# Patient Record
Sex: Female | Born: 1965 | Marital: Married | Smoking: Never smoker
Health system: Southern US, Community
[De-identification: ages and names within clinical notes are randomized; demographics above are authoritative.]

## PROBLEM LIST (undated history)

## (undated) DIAGNOSIS — H40059 Ocular hypertension, unspecified eye: Secondary | ICD-10-CM

## (undated) DIAGNOSIS — D259 Leiomyoma of uterus, unspecified: Secondary | ICD-10-CM

## (undated) DIAGNOSIS — IMO0001 Reserved for inherently not codable concepts without codable children: Secondary | ICD-10-CM

## (undated) DIAGNOSIS — R932 Abnormal findings on diagnostic imaging of liver and biliary tract: Secondary | ICD-10-CM

## (undated) HISTORY — DX: Reserved for inherently not codable concepts without codable children: IMO0001

## (undated) HISTORY — DX: Leiomyoma of uterus, unspecified: D25.9

## (undated) HISTORY — DX: Ocular hypertension, unspecified eye: H40.059

## (undated) HISTORY — DX: Abnormal findings on diagnostic imaging of liver and biliary tract: R93.2

---

## 2010-12-29 ENCOUNTER — Encounter: Payer: Self-pay | Admitting: Gastroenterology

## 2010-12-30 ENCOUNTER — Other Ambulatory Visit (HOSPITAL_COMMUNITY): Payer: Self-pay | Admitting: Gastroenterology

## 2010-12-30 DIAGNOSIS — R110 Nausea: Secondary | ICD-10-CM

## 2011-01-13 ENCOUNTER — Ambulatory Visit (HOSPITAL_COMMUNITY)
Admission: RE | Admit: 2011-01-13 | Discharge: 2011-01-13 | Disposition: A | Discharge status: Home / Self Care | Payer: BC Managed Care – PPO | Source: Ambulatory Visit | Attending: Gastroenterology | Admitting: Gastroenterology

## 2011-01-13 DIAGNOSIS — R110 Nausea: Secondary | ICD-10-CM

## 2011-01-13 DIAGNOSIS — R109 Unspecified abdominal pain: Secondary | ICD-10-CM | POA: Insufficient documentation

## 2011-01-13 DIAGNOSIS — N281 Cyst of kidney, acquired: Secondary | ICD-10-CM | POA: Insufficient documentation

## 2011-01-13 MED ORDER — TECHNETIUM TC 99M MEBROFENIN IV KIT
5.0000 | PACK | Freq: Once | INTRAVENOUS | Status: AC | PRN
  Administered 2011-01-13: 5 via INTRAVENOUS

## 2011-01-20 ENCOUNTER — Ambulatory Visit: Payer: Self-pay | Admitting: Gastroenterology

## 2011-02-09 ENCOUNTER — Encounter: Payer: Self-pay | Admitting: Internal Medicine

## 2011-02-23 ENCOUNTER — Ambulatory Visit (INDEPENDENT_AMBULATORY_CARE_PROVIDER_SITE_OTHER): Payer: BC Managed Care – PPO | Admitting: Internal Medicine

## 2011-02-23 ENCOUNTER — Encounter: Payer: Self-pay | Admitting: Internal Medicine

## 2011-02-23 VITALS — BP 100/72 | HR 96 | Temp 98.3°F | Ht 63.0 in | Wt 121.0 lb

## 2011-02-23 DIAGNOSIS — D219 Benign neoplasm of connective and other soft tissue, unspecified: Secondary | ICD-10-CM

## 2011-02-23 DIAGNOSIS — H40059 Ocular hypertension, unspecified eye: Secondary | ICD-10-CM

## 2011-02-23 DIAGNOSIS — R932 Abnormal findings on diagnostic imaging of liver and biliary tract: Secondary | ICD-10-CM

## 2011-02-23 DIAGNOSIS — H40009 Preglaucoma, unspecified, unspecified eye: Secondary | ICD-10-CM

## 2011-02-23 DIAGNOSIS — L709 Acne, unspecified: Secondary | ICD-10-CM

## 2011-02-23 DIAGNOSIS — D259 Leiomyoma of uterus, unspecified: Secondary | ICD-10-CM

## 2011-02-23 DIAGNOSIS — Z0000 Encounter for general adult medical examination without abnormal findings: Secondary | ICD-10-CM

## 2011-02-23 DIAGNOSIS — L708 Other acne: Secondary | ICD-10-CM

## 2011-02-23 MED ORDER — CLINDAMYCIN PHOS-BENZOYL PEROX 1-5 % EX GEL: CUTANEOUS | Status: DC

## 2011-02-23 NOTE — Progress Notes (Signed)
Subjective:    Patient ID: Katie Lawson, female    DOB: 15-Jul-1965, 45 y.o.   MRN: [id number]  HPI Patient comes in as new patient visit . Previous care was  In [facility] where she lived previously. She does have GYNE check this year Dr Juliene Pina dx of fibroids .   She recently underwent evaluation for acute abd/chest  pain per Dr Loreta Ave. No cv problem in ed evaluation.  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  She has no chronic meds.  Is monitored for Poss IOP stable No injury does battle knee pain  Anterior with some activity but does  Yoga and other activities  No cv pulm limitations  Review of Systems ROS:  GEN/ HEENTNo fever, significant weight changes sweats headaches vision problems hearing changes, CV/ PULM; No chest pain shortness of breath cough, syncope,edema  change in exercise tolerance. GI /GU: No adominal pain, vomiting, change in bowel habits. No blood in the stool. No significant GU symptoms. SKIN/HEME: ,no acute skin rashes suspicious lesions or bleeding. No lymphadenopathy, nodules, masses.  Has had adult acne and benza clin has helped in the past.  NEURO/ PSYCH:  No neurologic signs such as weakness numbness No depression anxiety. IMM/ Allergy: No unusual infections.  Allergy .   REST of 12 system review negative Past Medical History  Diagnosis Date  . Elevated IOP     watch for glaucoma  . Abnormal findings on diagnostic imaging of gall bladder     low EF    History   Social History  . Marital Status: Married    Spouse Name: N/A    Number of Children: N/A  . Years of Education: N/A   Occupational History  . Not on file.   Social History Main Topics  . Smoking status: Never Smoker   . Smokeless tobacco: Never Used  . Alcohol Use: No  . Drug Use: Not on file  . Sexually Active: Not on file   Other Topics Concern  . Not on file   Social History Narrative   H H  of  4 Married  Masters education   homemakerHusband works for [employer]  Smoke alarm seat belts no ets[place]    History reviewed. No pertinent past surgical history.  Family History  Problem Relation Age of Onset  . Hypertension Mother   . Healthy Daughter   . Healthy Daughter   . Breast cancer      GParent    No Known Allergies  Current Outpatient Prescriptions on File Prior to Visit  Medication Sig Dispense Refill  . clindamycin-benzoyl peroxide (BENZACLIN) gel Apply to affected area 2 times daily for adult acne  25 g  3    BP 100/72  Pulse 96  Temp(Src) 98.3 F (36.8 C) (Oral)  Ht 5\' 3"  (1.6 m)  Wt 121 lb (54.885 kg)  BMI 21.43 kg/m2  SpO2 99%  LMP 02/14/2011        Objective:   Physical Exam Physical Exam: Vital signs reviewed [id number] is a well-developed well-nourished alert cooperative  [place] female who appears her stated age in no acute distress.  HEENT: normocephalic  traumatic , Eyes: PERRL EOM's full, conjunctiva clear, Nares: paten,t no deformity discharge or tenderness., Ears: no deformity EAC's clear TMs with normal landmarks. Mouth: clear OP, no lesions, edema.  Moist mucous membranes. Dentition in adequate repair. NECK: supple without masses,  thyromegaly or bruits. CHEST/PULM:  Clear to auscultation and percussion breath sounds equal no wheeze , rales or rhonchi. No chest wall deformities or tenderness. CV: PMI is nondisplaced, S1 S2 no gallops, murmurs, rubs. Peripheral pulses are full without delay.No JVD .  Breast: normal by inspection . No dimpling, discharge, masses, tenderness or discharge .  ABDOMEN: Bowel sounds normal nontender  No guard or rebound, no hepato splenomegal no CVA tenderness.  No hernia. Extremtities:  No clubbing cyanosis or edema, no acute joint swelling or redness no focal atrophy NEURO:  Oriented x3, cranial nerves 3-12 appear to be intact, no obvious focal weakness,gait within normal limits no abnormal reflexes or asymmetrical SKIN:  No acute rashes normal turgor, color, no bruising or petechiae. Few papules on face  Acneiform with old hyperpigmentation PSYCH: Oriented, good eye contact, no obvious depression anxiety, cognition and judgment appear normal.  GYNE Per GYNE Labs form Dr Loreta Ave reviewed all normal cmet cbc tsh  No lipid but no hx of issues    Assessment & Plan:  Preventive Health Care  Counseled regarding healthy nutrition, exercise, sleep, injury prevention, calcium vit d and healthy weight . Has healthy ls had flu shot td un 2011 and pap 2012    Hx of fibroids per gyne EYE monitoring yearly for glaucoma.  Adult acne  Hx of good response to topical and will rx for this today and see how she does GB dysfunction  Now asymtomatic  Observing over

## 2011-02-23 NOTE — Patient Instructions (Signed)
Continue lifestyle intervention healthy eating and exercise . CHECK UP IN 2 YEARS or as needed.

## 2011-02-24 ENCOUNTER — Encounter: Payer: Self-pay | Admitting: Internal Medicine

## 2011-02-24 DIAGNOSIS — Z0000 Encounter for general adult medical examination without abnormal findings: Secondary | ICD-10-CM | POA: Insufficient documentation

## 2011-02-24 DIAGNOSIS — D219 Benign neoplasm of connective and other soft tissue, unspecified: Secondary | ICD-10-CM | POA: Insufficient documentation

## 2011-02-24 DIAGNOSIS — H40059 Ocular hypertension, unspecified eye: Secondary | ICD-10-CM | POA: Insufficient documentation

## 2011-02-24 DIAGNOSIS — L709 Acne, unspecified: Secondary | ICD-10-CM | POA: Insufficient documentation

## 2011-02-24 DIAGNOSIS — R932 Abnormal findings on diagnostic imaging of liver and biliary tract: Secondary | ICD-10-CM | POA: Insufficient documentation

## 2012-01-26 ENCOUNTER — Telehealth: Payer: Self-pay | Admitting: Internal Medicine

## 2012-01-26 NOTE — Telephone Encounter (Signed)
Pt unable to do wednesdays

## 2012-01-26 NOTE — Telephone Encounter (Signed)
Pt would like to be worked in for CPX  In Dec or Jan. Please advise when we can work her in.

## 2012-01-26 NOTE — Telephone Encounter (Signed)
How about Wednesday January 8th

## 2012-01-27 NOTE — Telephone Encounter (Signed)
How about Monday pm Jan 13th?  if not asks when she can come.

## 2012-01-30 ENCOUNTER — Encounter: Payer: Self-pay | Admitting: Internal Medicine

## 2012-01-30 NOTE — Telephone Encounter (Signed)
Pt scheduled 04/09/2012 for CPX @ 8:15am!

## 2012-01-31 ENCOUNTER — Ambulatory Visit (INDEPENDENT_AMBULATORY_CARE_PROVIDER_SITE_OTHER): Payer: BC Managed Care – PPO | Admitting: Family Medicine

## 2012-01-31 DIAGNOSIS — Z23 Encounter for immunization: Secondary | ICD-10-CM

## 2012-02-03 ENCOUNTER — Encounter: Payer: Self-pay | Admitting: Internal Medicine

## 2012-02-24 ENCOUNTER — Encounter: Payer: Self-pay | Admitting: Internal Medicine

## 2012-04-03 ENCOUNTER — Other Ambulatory Visit (INDEPENDENT_AMBULATORY_CARE_PROVIDER_SITE_OTHER): Payer: BC Managed Care – PPO

## 2012-04-03 DIAGNOSIS — Z0000 Encounter for general adult medical examination without abnormal findings: Secondary | ICD-10-CM

## 2012-04-03 LAB — BASIC METABOLIC PANEL
CO2: 27 mEq/L (ref 19–32)
Calcium: 8.8 mg/dL (ref 8.4–10.5)
Chloride: 103 mEq/L (ref 96–112)
Glucose, Bld: 88 mg/dL (ref 70–99)
Potassium: 4.1 mEq/L (ref 3.5–5.1)
Sodium: 135 mEq/L (ref 135–145)

## 2012-04-03 LAB — LIPID PANEL
HDL: 44.7 mg/dL (ref >39.00)
LDL Cholesterol: 94 mg/dL (ref 0–99)
Total CHOL/HDL Ratio: 4
Triglycerides: 128 mg/dL (ref 0.0–149.0)
VLDL: 25.6 mg/dL (ref 0.0–40.0)

## 2012-04-03 LAB — CBC WITH DIFFERENTIAL/PLATELET
Basophils Absolute: 0 10*3/uL (ref 0.0–0.1)
Eosinophils Relative: 1.5 % (ref 0.0–5.0)
HCT: 34.8 % — ABNORMAL LOW (ref 36.0–46.0)
Lymphocytes Relative: 36.4 % (ref 12.0–46.0)
Lymphs Abs: 2.5 10*3/uL (ref 0.7–4.0)
Monocytes Relative: 5.7 % (ref 3.0–12.0)
Platelets: 206 10*3/uL (ref 150.0–400.0)
WBC: 6.9 10*3/uL (ref 4.5–10.5)

## 2012-04-03 LAB — HEPATIC FUNCTION PANEL
Albumin: 3.7 g/dL (ref 3.5–5.2)
Total Protein: 6.6 g/dL (ref 6.0–8.3)

## 2012-04-03 LAB — TSH: TSH: 2.56 u[IU]/mL (ref 0.35–5.50)

## 2012-04-09 ENCOUNTER — Encounter: Payer: BC Managed Care – PPO | Admitting: Internal Medicine

## 2012-04-09 ENCOUNTER — Encounter: Payer: Self-pay | Admitting: Internal Medicine

## 2012-04-09 NOTE — Progress Notes (Signed)
Pt came late for her appt and had to reeschedule  Document opened for review

## 2012-04-18 ENCOUNTER — Ambulatory Visit (INDEPENDENT_AMBULATORY_CARE_PROVIDER_SITE_OTHER): Payer: BC Managed Care – PPO | Admitting: Internal Medicine

## 2012-04-18 ENCOUNTER — Encounter: Payer: Self-pay | Admitting: Internal Medicine

## 2012-04-18 VITALS — BP 118/80 | HR 78 | Temp 97.8°F | Ht 63.0 in | Wt 120.0 lb

## 2012-04-18 DIAGNOSIS — Z0000 Encounter for general adult medical examination without abnormal findings: Secondary | ICD-10-CM

## 2012-04-18 DIAGNOSIS — L709 Acne, unspecified: Secondary | ICD-10-CM

## 2012-04-18 DIAGNOSIS — L708 Other acne: Secondary | ICD-10-CM

## 2012-04-18 DIAGNOSIS — D649 Anemia, unspecified: Secondary | ICD-10-CM | POA: Insufficient documentation

## 2012-04-18 MED ORDER — CLINDAMYCIN PHOS-BENZOYL PEROX 1-5 % EX GEL: CUTANEOUS | Status: DC

## 2012-04-18 NOTE — Progress Notes (Signed)
Chief Complaint  Patient presents with  . Annual Exam    refill acne med    HPI: Patient comes in today for Preventive Health Care visit  No major change in health status since last visit . Monitoring mammogram ;periods reg  Not that heavy  Last 5 days ocass momentary light headed with standing up .  But no exercise intolerance very mild sx  Like a shift  Last moments not minutes.  No other sx.  Has prominent color veins in legs sometimes ache at end of day no swelling sig. ROS:  GEN/ HEENT: No fever, significant weight changes sweats headaches vision problems hearing changes, CV/ PULM; No chest pain shortness of breath cough, syncope,edema  change in exercise tolerance. GI /GU: No adominal pain, vomiting, change in bowel habits. No blood in the stool. No significant GU symptoms. SKIN/HEME: ,no acute skin rashes suspicious lesions or bleeding. No lymphadenopathy, nodules, masses.  NEURO/ PSYCH:  No neurologic signs such as weakness numbness. No depression anxiety. IMM/ Allergy: No unusual infections.  Allergy .   REST of 12 system review negative except as per HPI   Past Medical History  Diagnosis Date  . Elevated IOP     watch for glaucoma  . Abnormal findings on diagnostic imaging of gall bladder     low EF   fam hx mom ht mat uncle had some Mis in 42s  mgf heart death  Elderly aunt with poss arryhtmias . No first degree relative with premature scd etc .  Family History  Problem Relation Age of Onset  . Hypertension Mother   . Healthy Daughter   . Healthy Daughter   . Breast cancer      GParent  . Heart attack      mgf [facility]    History   Social History  . Marital Status: Married    Spouse Name: N/A    Number of Children: N/A  . Years of Education: N/A   Social History Main Topics  . Smoking status: Never Smoker   . Smokeless tobacco: Never Used  . Alcohol Use: No  . Drug Use: None  . Sexually Active: None   Other Topics Concern  . None   Social History  Narrative   H H  of  4 Married  Masters education  homemakerHusband works for [employer]  Smoke alarm seat belts no ets. [place]Exercise  At least 3 x per week. Caffiene; diet coke .     Outpatient Encounter Prescriptions as of 04/18/2012  Medication Sig Dispense Refill  . clindamycin-benzoyl peroxide (BENZACLIN) gel Apply to affected area 2 times daily for adult acne  25 g  3  . [DISCONTINUED] clindamycin-benzoyl peroxide (BENZACLIN) gel Apply to affected area 2 times daily for adult acne  25 g  3    EXAM:  BP 118/80  Pulse 78  Temp 97.8 F (36.6 C) (Oral)  Ht 5\' 3"  (1.6 m)  Wt 120 lb (54.432 kg)  BMI 21.26 kg/m2  SpO2 98%  LMP 04/17/2012  Body mass index is 21.26 kg/(m^2).  Physical Exam: Vital signs reviewed BJY:NWGN is a well-developed well-nourished alert cooperative   female who appears her stated age in no acute distress.  HEENT: normocephalic atraumatic , Eyes: PERRL EOM's full, conjunctiva clear, Nares: paten,t no deformity discharge or tenderness., Ears: no deformity EAC's clear TMs with normal landmarks. Mouth: clear OP, no lesions, edema.  Moist mucous membranes. Dentition in adequate repair. NECK: supple without  masses, thyromegaly or bruits. CHEST/PULM:  Clear to auscultation and percussion breath sounds equal no wheeze , rales or rhonchi. No chest wall deformities or tenderness. CV: PMI is nondisplaced, S1 S2 no gallops, murmurs, rubs. Peripheral pulses are full without delay.No JVD .  Breast: normal by inspection . No dimpling, discharge, masses, tenderness or discharge  ABDOMEN: Bowel sounds normal nontender  No guard or rebound, no hepato splenomegal no CVA tenderness.  No hernia. Extremtities:  No clubbing cyanosis or edema, no acute joint swelling or redness no focal atrophy few prominent veins   No ulcers edema . Redness  NEURO:  Oriented x3, cranial nerves 3-12 appear to be intact, no obvious focal weakness,gait within normal limits  no abnormal reflexes or asymmetrical SKIN: No acute rashes normal turgor, color, no bruising or petechiae. fe papules on face  No stria  PSYCH: Oriented, good eye contact, no obvious depression anxiety, cognition and judgment appear normal. LN: no cervical axillary inguinal adenopathy  Lab Results  Component Value Date   WBC 6.9 04/03/2012   HGB 11.8* 04/03/2012   HCT 34.8* 04/03/2012   PLT 206.0 04/03/2012   GLUCOSE 88 04/03/2012   CHOL 164 04/03/2012   TRIG 128.0 04/03/2012   HDL 44.70 04/03/2012   LDLCALC 94 04/03/2012   ALT 17 04/03/2012   AST 18 04/03/2012   NA 135 04/03/2012   K 4.1 04/03/2012   CL 103 04/03/2012   CREATININE 0.6 04/03/2012   BUN 13 04/03/2012   CO2 27 04/03/2012   TSH 2.56 04/03/2012    ASSESSMENT AND PLAN:  Discussed the following assessment and plan:  1. Visit for preventive health examination   2. Mild anemia    prob iron defi  supplement and pay attnetion to diet  fu if needed  3. Adult acne    refill med doing well with this   Minimal venous prominence try otc compression when upright for a while  Monitor the misl sx and fu if  persistent or progressive  Normal exam  Patient Care Team: Madelin Headings, MD as PCP - General (Internal Medicine) Robley Fries, MD as Attending Physician (Obstetrics and Gynecology) Charna Elizabeth, MD as Attending Physician (Gastroenterology) Patient Instructions  Continue lifestyle intervention healthy eating and exercise . Fu if  Getting increasing  Sx of concern . Your heart exam is good and nl blood pressure.You are mildly anemia probably iron deficient from  Iron loss in periods.  Can take extra iron pill ferrous sulfate or other preps every day for a month and then womens multivit with iron t help . Can recheck  Lab if concerning .    Preventive Care for Adults, Female A healthy lifestyle and preventive care can promote health and wellness. Preventive health guidelines for women include the following key practices.  A routine yearly  physical is a good way to check with your caregiver about your health and preventive screening. It is a chance to share any concerns and updates on your health, and to receive a thorough exam.  Visit your dentist for a routine exam and preventive care every 6 months. Brush your teeth twice a day and floss once a day. Good oral hygiene prevents tooth decay and gum disease.  The frequency of eye exams is based on your age, health, family medical history, use of contact lenses, and other factors. Follow your caregiver's recommendations for frequency of eye exams.  Eat a healthy diet. Foods like vegetables, fruits, whole grains, low-fat dairy products,  and lean protein foods contain the nutrients you need without too many calories. Decrease your intake of foods high in solid fats, added sugars, and salt. Eat the right amount of calories for you.Get information about a proper diet from your caregiver, if necessary.  Regular physical exercise is one of the most important things you can do for your health. Most adults should get at least 150 minutes of moderate-intensity exercise (any activity that increases your heart rate and causes you to sweat) each week. In addition, most adults need muscle-strengthening exercises on 2 or more days a week.  Maintain a healthy weight. The body mass index (BMI) is a screening tool to identify possible weight problems. It provides an estimate of body fat based on height and weight. Your caregiver can help determine your BMI, and can help you achieve or maintain a healthy weight.For adults 20 years and older:  A BMI below 18.5 is considered underweight.  A BMI of 18.5 to 24.9 is normal.  A BMI of 25 to 29.9 is considered overweight.  A BMI of 30 and above is considered obese.  Maintain normal blood lipids and cholesterol levels by exercising and minimizing your intake of saturated fat. Eat a balanced diet with plenty of fruit and vegetables. Blood tests for lipids  and cholesterol should begin at age 78 and be repeated every 5 years. If your lipid or cholesterol levels are high, you are over 50, or you are at high risk for heart disease, you may need your cholesterol levels checked more frequently.Ongoing high lipid and cholesterol levels should be treated with medicines if diet and exercise are not effective.  If you smoke, find out from your caregiver how to quit. If you do not use tobacco, do not start.  If you are pregnant, do not drink alcohol. If you are breastfeeding, be very cautious about drinking alcohol. If you are not pregnant and choose to drink alcohol, do not exceed 1 drink per day. One drink is considered to be 12 ounces (355 mL) of beer, 5 ounces (148 mL) of wine, or 1.5 ounces (44 mL) of liquor.  Avoid use of street drugs. Do not share needles with anyone. Ask for help if you need support or instructions about stopping the use of drugs.  High blood pressure causes heart disease and increases the risk of stroke. Your blood pressure should be checked at least every 1 to 2 years. Ongoing high blood pressure should be treated with medicines if weight loss and exercise are not effective.  If you are 81 to 47 years old, ask your caregiver if you should take aspirin to prevent strokes.  Diabetes screening involves taking a blood sample to check your fasting blood sugar level. This should be done once every 3 years, after age 31, if you are within normal weight and without risk factors for diabetes. Testing should be considered at a younger age or be carried out more frequently if you are overweight and have at least 1 risk factor for diabetes.  Breast cancer screening is essential preventive care for women. You should practice "breast self-awareness." This means understanding the normal appearance and feel of your breasts and may include breast self-examination. Any changes detected, no matter how small, should be reported to a caregiver. Women in  their 24s and 30s should have a clinical breast exam (CBE) by a caregiver as part of a regular health exam every 1 to 3 years. After age 18, women should have a CBE  every year. Starting at age 46, women should consider having a mammography (breast X-ray test) every year. Women who have a family history of breast cancer should talk to their caregiver about genetic screening. Women at a high risk of breast cancer should talk to their caregivers about having magnetic resonance imaging (MRI) and a mammography every year.  The Pap test is a screening test for cervical cancer. A Pap test can show cell changes on the cervix that might become cervical cancer if left untreated. A Pap test is a procedure in which cells are obtained and examined from the lower end of the uterus (cervix).  Women should have a Pap test starting at age 75.  Between ages 42 and 70, Pap tests should be repeated every 2 years.  Beginning at age 11, you should have a Pap test every 3 years as long as the past 3 Pap tests have been normal.  Some women have medical problems that increase the chance of getting cervical cancer. Talk to your caregiver about these problems. It is especially important to talk to your caregiver if a new problem develops soon after your last Pap test. In these cases, your caregiver may recommend more frequent screening and Pap tests.  The above recommendations are the same for women who have or have not gotten the vaccine for human papillomavirus (HPV).  If you had a hysterectomy for a problem that was not cancer or a condition that could lead to cancer, then you no longer need Pap tests. Even if you no longer need a Pap test, a regular exam is a good idea to make sure no other problems are starting.  If you are between ages 59 and 3, and you have had normal Pap tests going back 10 years, you no longer need Pap tests. Even if you no longer need a Pap test, a regular exam is a good idea to make sure no other  problems are starting.  If you have had past treatment for cervical cancer or a condition that could lead to cancer, you need Pap tests and screening for cancer for at least 20 years after your treatment.  If Pap tests have been discontinued, risk factors (such as a new sexual partner) need to be reassessed to determine if screening should be resumed.  The HPV test is an additional test that may be used for cervical cancer screening. The HPV test looks for the virus that can cause the cell changes on the cervix. The cells collected during the Pap test can be tested for HPV. The HPV test could be used to screen women aged 25 years and older, and should be used in women of any age who have unclear Pap test results. After the age of 20, women should have HPV testing at the same frequency as a Pap test.  Colorectal cancer can be detected and often prevented. Most routine colorectal cancer screening begins at the age of 52 and continues through age 108. However, your caregiver may recommend screening at an earlier age if you have risk factors for colon cancer. On a yearly basis, your caregiver may provide home test kits to check for hidden blood in the stool. Use of a small camera at the end of a tube, to directly examine the colon (sigmoidoscopy or colonoscopy), can detect the earliest forms of colorectal cancer. Talk to your caregiver about this at age 67, when routine screening begins. Direct examination of the colon should be repeated every 5 to  10 years through age 21, unless early forms of pre-cancerous polyps or small growths are found.  Hepatitis C blood testing is recommended for all people born from [birth year] through 1965 and any individual with known risks for hepatitis C.  Practice safe sex. Use condoms and avoid high-risk sexual practices to reduce the spread of sexually transmitted infections (STIs). STIs include gonorrhea, chlamydia, syphilis, trichomonas, herpes, HPV, and human immunodeficiency  virus (HIV). Herpes, HIV, and HPV are viral illnesses that have no cure. They can result in disability, cancer, and death. Sexually active women aged 27 and younger should be checked for chlamydia. Older women with new or multiple partners should also be tested for chlamydia. Testing for other STIs is recommended if you are sexually active and at increased risk.  Osteoporosis is a disease in which the bones lose minerals and strength with aging. This can result in serious bone fractures. The risk of osteoporosis can be identified using a bone density scan. Women ages 61 and over and women at risk for fractures or osteoporosis should discuss screening with their caregivers. Ask your caregiver whether you should take a calcium supplement or vitamin D to reduce the rate of osteoporosis.  Menopause can be associated with physical symptoms and risks. Hormone replacement therapy is available to decrease symptoms and risks. You should talk to your caregiver about whether hormone replacement therapy is right for you.  Use sunscreen with sun protection factor (SPF) of 30 or more. Apply sunscreen liberally and repeatedly throughout the day. You should seek shade when your shadow is shorter than you. Protect yourself by wearing long sleeves, pants, a wide-brimmed hat, and sunglasses year round, whenever you are outdoors.  Once a month, do a whole body skin exam, using a mirror to look at the skin on your back. Notify your caregiver of new moles, moles that have irregular borders, moles that are larger than a pencil eraser, or moles that have changed in shape or color.  Stay current with required immunizations.  Influenza. You need a dose every fall (or winter). The composition of the flu vaccine changes each year, so being vaccinated once is not enough.  Pneumococcal polysaccharide. You need 1 to 2 doses if you smoke cigarettes or if you have certain chronic medical conditions. You need 1 dose at age 37 (or  older) if you have never been vaccinated.  Tetanus, diphtheria, pertussis (Tdap, Td). Get 1 dose of Tdap vaccine if you are younger than age 58, are over 9 and have contact with an infant, are a Research scientist (physical sciences), are pregnant, or simply want to be protected from whooping cough. After that, you need a Td booster dose every 10 years. Consult your caregiver if you have not had at least 3 tetanus and diphtheria-containing shots sometime in your life or have a deep or dirty wound.  HPV. You need this vaccine if you are a woman age 54 or younger. The vaccine is given in 3 doses over 6 months.  Measles, mumps, rubella (MMR). You need at least 1 dose of MMR if you were born in 1957 or later. You may also need a second dose.  Meningococcal. If you are age 63 to 29 and a first-year college student living in a residence hall, or have one of several medical conditions, you need to get vaccinated against meningococcal disease. You may also need additional booster doses.  Zoster (shingles). If you are age 70 or older, you should get this vaccine.  Varicella (chickenpox). If  you have never had chickenpox or you were vaccinated but received only 1 dose, talk to your caregiver to find out if you need this vaccine.  Hepatitis A. You need this vaccine if you have a specific risk factor for hepatitis A virus infection or you simply wish to be protected from this disease. The vaccine is usually given as 2 doses, 6 to 18 months apart.  Hepatitis B. You need this vaccine if you have a specific risk factor for hepatitis B virus infection or you simply wish to be protected from this disease. The vaccine is given in 3 doses, usually over 6 months. Preventive Services / Frequency Ages 27 to 55  Blood pressure check.** / Every 1 to 2 years.  Lipid and cholesterol check.** / Every 5 years beginning at age 14.  Clinical breast exam.** / Every 3 years for women in their 56s and 30s.  Pap test.** / Every 2 years from  ages 43 through 5. Every 3 years starting at age 46 through age 80 or 43 with a history of 3 consecutive normal Pap tests.  HPV screening.** / Every 3 years from ages 37 through ages 32 to 52 with a history of 3 consecutive normal Pap tests.  Hepatitis C blood test.** / For any individual with known risks for hepatitis C.  Skin self-exam. / Monthly.  Influenza immunization.** / Every year.  Pneumococcal polysaccharide immunization.** / 1 to 2 doses if you smoke cigarettes or if you have certain chronic medical conditions.  Tetanus, diphtheria, pertussis (Tdap, Td) immunization. / A one-time dose of Tdap vaccine. After that, you need a Td booster dose every 10 years.  HPV immunization. / 3 doses over 6 months, if you are 33 and younger.  Measles, mumps, rubella (MMR) immunization. / You need at least 1 dose of MMR if you were born in 1957 or later. You may also need a second dose.  Meningococcal immunization. / 1 dose if you are age 43 to 26 and a first-year college student living in a residence hall, or have one of several medical conditions, you need to get vaccinated against meningococcal disease. You may also need additional booster doses.  Varicella immunization.** / Consult your caregiver.  Hepatitis A immunization.** / Consult your caregiver. 2 doses, 6 to 18 months apart.  Hepatitis B immunization.** / Consult your caregiver. 3 doses usually over 6 months. Ages 11 to 50  Blood pressure check.** / Every 1 to 2 years.  Lipid and cholesterol check.** / Every 5 years beginning at age 102.  Clinical breast exam.** / Every year after age 59.  Mammogram.** / Every year beginning at age 54 and continuing for as long as you are in good health. Consult with your caregiver.  Pap test.** / Every 3 years starting at age 102 through age 30 or 43 with a history of 3 consecutive normal Pap tests.  HPV screening.** / Every 3 years from ages 58 through ages 23 to 56 with a history of 3  consecutive normal Pap tests.  Fecal occult blood test (FOBT) of stool. / Every year beginning at age 51 and continuing until age 68. You may not need to do this test if you get a colonoscopy every 10 years.  Flexible sigmoidoscopy or colonoscopy.** / Every 5 years for a flexible sigmoidoscopy or every 10 years for a colonoscopy beginning at age 57 and continuing until age 19.  Hepatitis C blood test.** / For all people born from [birth year] through 1965  and any individual with known risks for hepatitis C.  Skin self-exam. / Monthly.  Influenza immunization.** / Every year.  Pneumococcal polysaccharide immunization.** / 1 to 2 doses if you smoke cigarettes or if you have certain chronic medical conditions.  Tetanus, diphtheria, pertussis (Tdap, Td) immunization.** / A one-time dose of Tdap vaccine. After that, you need a Td booster dose every 10 years.  Measles, mumps, rubella (MMR) immunization. / You need at least 1 dose of MMR if you were born in 1957 or later. You may also need a second dose.  Varicella immunization.** / Consult your caregiver.  Meningococcal immunization.** / Consult your caregiver.  Hepatitis A immunization.** / Consult your caregiver. 2 doses, 6 to 18 months apart.  Hepatitis B immunization.** / Consult your caregiver. 3 doses, usually over 6 months. Ages 26 and over  Blood pressure check.** / Every 1 to 2 years.  Lipid and cholesterol check.** / Every 5 years beginning at age 42.  Clinical breast exam.** / Every year after age 65.  Mammogram.** / Every year beginning at age 28 and continuing for as long as you are in good health. Consult with your caregiver.  Pap test.** / Every 3 years starting at age 57 through age 74 or 12 with a 3 consecutive normal Pap tests. Testing can be stopped between 65 and 70 with 3 consecutive normal Pap tests and no abnormal Pap or HPV tests in the past 10 years.  HPV screening.** / Every 3 years from ages 76 through ages 57 or 64  with a history of 3 consecutive normal Pap tests. Testing can be stopped between 65 and 70 with 3 consecutive normal Pap tests and no abnormal Pap or HPV tests in the past 10 years.  Fecal occult blood test (FOBT) of stool. / Every year beginning at age 10 and continuing until age 57. You may not need to do this test if you get a colonoscopy every 10 years.  Flexible sigmoidoscopy or colonoscopy.** / Every 5 years for a flexible sigmoidoscopy or every 10 years for a colonoscopy beginning at age 54 and continuing until age 68.  Hepatitis C blood test.** / For all people born from [birth year] through 1965 and any individual with known risks for hepatitis C.  Osteoporosis screening.** / A one-time screening for women ages 24 and over and women at risk for fractures or osteoporosis.  Skin self-exam. / Monthly.  Influenza immunization.** / Every year.  Pneumococcal polysaccharide immunization.** / 1 dose at age 45 (or older) if you have never been vaccinated.  Tetanus, diphtheria, pertussis (Tdap, Td) immunization. / A one-time dose of Tdap vaccine if you are over 65 and have contact with an infant, are a Research scientist (physical sciences), or simply want to be protected from whooping cough. After that, you need a Td booster dose every 10 years.  Varicella immunization.** / Consult your caregiver.  Meningococcal immunization.** / Consult your caregiver.  Hepatitis A immunization.** / Consult your caregiver. 2 doses, 6 to 18 months apart.  Hepatitis B immunization.** / Check with your caregiver. 3 doses, usually over 6 months. ** Family history and personal history of risk and conditions may change your caregiver's recommendations. Document Released: 05/10/2001 Document Revised: 06/06/2011 Document Reviewed: 08/09/2010 [facility] Patient Information 2013 [place], [place].    Neta Mends. Ladiamond Gallina M.D.   Health Maintenance  Topic Date Due  . Influenza Vaccine  11/26/2012  . Mammogram  02/09/2013  . Pap Smear  09/26/2014    . Tetanus/tdap  04/19/2019  Health Maintenance Review

## 2012-04-18 NOTE — Patient Instructions (Addendum)
Continue lifestyle intervention healthy eating and exercise . Fu if  Getting increasing  Sx of concern . Your heart exam is good and nl blood pressure.You are mildly anemia probably iron deficient from  Iron loss in periods.  Can take extra iron pill ferrous sulfate or other preps every day for a month and then womens multivit with iron t help . Can recheck  Lab if concerning .    Preventive Care for Adults, Female A healthy lifestyle and preventive care can promote health and wellness. Preventive health guidelines for women include the following key practices.  A routine yearly physical is a good way to check with your caregiver about your health and preventive screening. It is a chance to share any concerns and updates on your health, and to receive a thorough exam.  Visit your dentist for a routine exam and preventive care every 6 months. Brush your teeth twice a day and floss once a day. Good oral hygiene prevents tooth decay and gum disease.  The frequency of eye exams is based on your age, health, family medical history, use of contact lenses, and other factors. Follow your caregiver's recommendations for frequency of eye exams.  Eat a healthy diet. Foods like vegetables, fruits, whole grains, low-fat dairy products, and lean protein foods contain the nutrients you need without too many calories. Decrease your intake of foods high in solid fats, added sugars, and salt. Eat the right amount of calories for you.Get information about a proper diet from your caregiver, if necessary.  Regular physical exercise is one of the most important things you can do for your health. Most adults should get at least 150 minutes of moderate-intensity exercise (any activity that increases your heart rate and causes you to sweat) each week. In addition, most adults need muscle-strengthening exercises on 2 or more days a week.  Maintain a healthy weight. The body mass index (BMI) is a screening tool to identify  possible weight problems. It provides an estimate of body fat based on height and weight. Your caregiver can help determine your BMI, and can help you achieve or maintain a healthy weight.For adults 20 years and older:  A BMI below 18.5 is considered underweight.  A BMI of 18.5 to 24.9 is normal.  A BMI of 25 to 29.9 is considered overweight.  A BMI of 30 and above is considered obese.  Maintain normal blood lipids and cholesterol levels by exercising and minimizing your intake of saturated fat. Eat a balanced diet with plenty of fruit and vegetables. Blood tests for lipids and cholesterol should begin at age 57 and be repeated every 5 years. If your lipid or cholesterol levels are high, you are over 50, or you are at high risk for heart disease, you may need your cholesterol levels checked more frequently.Ongoing high lipid and cholesterol levels should be treated with medicines if diet and exercise are not effective.  If you smoke, find out from your caregiver how to quit. If you do not use tobacco, do not start.  If you are pregnant, do not drink alcohol. If you are breastfeeding, be very cautious about drinking alcohol. If you are not pregnant and choose to drink alcohol, do not exceed 1 drink per day. One drink is considered to be 12 ounces (355 mL) of beer, 5 ounces (148 mL) of wine, or 1.5 ounces (44 mL) of liquor.  Avoid use of street drugs. Do not share needles with anyone. Ask for help if you need  support or instructions about stopping the use of drugs.  High blood pressure causes heart disease and increases the risk of stroke. Your blood pressure should be checked at least every 1 to 2 years. Ongoing high blood pressure should be treated with medicines if weight loss and exercise are not effective.  If you are 16 to 47 years old, ask your caregiver if you should take aspirin to prevent strokes.  Diabetes screening involves taking a blood sample to check your fasting blood sugar  level. This should be done once every 3 years, after age 30, if you are within normal weight and without risk factors for diabetes. Testing should be considered at a younger age or be carried out more frequently if you are overweight and have at least 1 risk factor for diabetes.  Breast cancer screening is essential preventive care for women. You should practice "breast self-awareness." This means understanding the normal appearance and feel of your breasts and may include breast self-examination. Any changes detected, no matter how small, should be reported to a caregiver. Women in their 65s and 30s should have a clinical breast exam (CBE) by a caregiver as part of a regular health exam every 1 to 3 years. After age 75, women should have a CBE every year. Starting at age 58, women should consider having a mammography (breast X-ray test) every year. Women who have a family history of breast cancer should talk to their caregiver about genetic screening. Women at a high risk of breast cancer should talk to their caregivers about having magnetic resonance imaging (MRI) and a mammography every year.  The Pap test is a screening test for cervical cancer. A Pap test can show cell changes on the cervix that might become cervical cancer if left untreated. A Pap test is a procedure in which cells are obtained and examined from the lower end of the uterus (cervix).  Women should have a Pap test starting at age 44.  Between ages 38 and 14, Pap tests should be repeated every 2 years.  Beginning at age 79, you should have a Pap test every 3 years as long as the past 3 Pap tests have been normal.  Some women have medical problems that increase the chance of getting cervical cancer. Talk to your caregiver about these problems. It is especially important to talk to your caregiver if a new problem develops soon after your last Pap test. In these cases, your caregiver may recommend more frequent screening and Pap  tests.  The above recommendations are the same for women who have or have not gotten the vaccine for human papillomavirus (HPV).  If you had a hysterectomy for a problem that was not cancer or a condition that could lead to cancer, then you no longer need Pap tests. Even if you no longer need a Pap test, a regular exam is a good idea to make sure no other problems are starting.  If you are between ages 6 and 31, and you have had normal Pap tests going back 10 years, you no longer need Pap tests. Even if you no longer need a Pap test, a regular exam is a good idea to make sure no other problems are starting.  If you have had past treatment for cervical cancer or a condition that could lead to cancer, you need Pap tests and screening for cancer for at least 20 years after your treatment.  If Pap tests have been discontinued, risk factors (such as a  new sexual partner) need to be reassessed to determine if screening should be resumed.  The HPV test is an additional test that may be used for cervical cancer screening. The HPV test looks for the virus that can cause the cell changes on the cervix. The cells collected during the Pap test can be tested for HPV. The HPV test could be used to screen women aged 22 years and older, and should be used in women of any age who have unclear Pap test results. After the age of 59, women should have HPV testing at the same frequency as a Pap test.  Colorectal cancer can be detected and often prevented. Most routine colorectal cancer screening begins at the age of 39 and continues through age 71. However, your caregiver may recommend screening at an earlier age if you have risk factors for colon cancer. On a yearly basis, your caregiver may provide home test kits to check for hidden blood in the stool. Use of a small camera at the end of a tube, to directly examine the colon (sigmoidoscopy or colonoscopy), can detect the earliest forms of colorectal cancer. Talk to your  caregiver about this at age 71, when routine screening begins. Direct examination of the colon should be repeated every 5 to 10 years through age 73, unless early forms of pre-cancerous polyps or small growths are found.  Hepatitis C blood testing is recommended for all people born from [birth year] through 1965 and any individual with known risks for hepatitis C.  Practice safe sex. Use condoms and avoid high-risk sexual practices to reduce the spread of sexually transmitted infections (STIs). STIs include gonorrhea, chlamydia, syphilis, trichomonas, herpes, HPV, and human immunodeficiency virus (HIV). Herpes, HIV, and HPV are viral illnesses that have no cure. They can result in disability, cancer, and death. Sexually active women aged 68 and younger should be checked for chlamydia. Older women with new or multiple partners should also be tested for chlamydia. Testing for other STIs is recommended if you are sexually active and at increased risk.  Osteoporosis is a disease in which the bones lose minerals and strength with aging. This can result in serious bone fractures. The risk of osteoporosis can be identified using a bone density scan. Women ages 18 and over and women at risk for fractures or osteoporosis should discuss screening with their caregivers. Ask your caregiver whether you should take a calcium supplement or vitamin D to reduce the rate of osteoporosis.  Menopause can be associated with physical symptoms and risks. Hormone replacement therapy is available to decrease symptoms and risks. You should talk to your caregiver about whether hormone replacement therapy is right for you.  Use sunscreen with sun protection factor (SPF) of 30 or more. Apply sunscreen liberally and repeatedly throughout the day. You should seek shade when your shadow is shorter than you. Protect yourself by wearing long sleeves, pants, a wide-brimmed hat, and sunglasses year round, whenever you are outdoors.  Once a month,  do a whole body skin exam, using a mirror to look at the skin on your back. Notify your caregiver of new moles, moles that have irregular borders, moles that are larger than a pencil eraser, or moles that have changed in shape or color.  Stay current with required immunizations.  Influenza. You need a dose every fall (or winter). The composition of the flu vaccine changes each year, so being vaccinated once is not enough.  Pneumococcal polysaccharide. You need 1 to 2 doses if you  smoke cigarettes or if you have certain chronic medical conditions. You need 1 dose at age 61 (or older) if you have never been vaccinated.  Tetanus, diphtheria, pertussis (Tdap, Td). Get 1 dose of Tdap vaccine if you are younger than age 15, are over 27 and have contact with an infant, are a Research scientist (physical sciences), are pregnant, or simply want to be protected from whooping cough. After that, you need a Td booster dose every 10 years. Consult your caregiver if you have not had at least 3 tetanus and diphtheria-containing shots sometime in your life or have a deep or dirty wound.  HPV. You need this vaccine if you are a woman age 78 or younger. The vaccine is given in 3 doses over 6 months.  Measles, mumps, rubella (MMR). You need at least 1 dose of MMR if you were born in 1957 or later. You may also need a second dose.  Meningococcal. If you are age 60 to 39 and a first-year college student living in a residence hall, or have one of several medical conditions, you need to get vaccinated against meningococcal disease. You may also need additional booster doses.  Zoster (shingles). If you are age 63 or older, you should get this vaccine.  Varicella (chickenpox). If you have never had chickenpox or you were vaccinated but received only 1 dose, talk to your caregiver to find out if you need this vaccine.  Hepatitis A. You need this vaccine if you have a specific risk factor for hepatitis A virus infection or you simply wish to be  protected from this disease. The vaccine is usually given as 2 doses, 6 to 18 months apart.  Hepatitis B. You need this vaccine if you have a specific risk factor for hepatitis B virus infection or you simply wish to be protected from this disease. The vaccine is given in 3 doses, usually over 6 months. Preventive Services / Frequency Ages 91 to 69  Blood pressure check.** / Every 1 to 2 years.  Lipid and cholesterol check.** / Every 5 years beginning at age 2.  Clinical breast exam.** / Every 3 years for women in their 62s and 30s.  Pap test.** / Every 2 years from ages 59 through 20. Every 3 years starting at age 29 through age 78 or 49 with a history of 3 consecutive normal Pap tests.  HPV screening.** / Every 3 years from ages 12 through ages 18 to 37 with a history of 3 consecutive normal Pap tests.  Hepatitis C blood test.** / For any individual with known risks for hepatitis C.  Skin self-exam. / Monthly.  Influenza immunization.** / Every year.  Pneumococcal polysaccharide immunization.** / 1 to 2 doses if you smoke cigarettes or if you have certain chronic medical conditions.  Tetanus, diphtheria, pertussis (Tdap, Td) immunization. / A one-time dose of Tdap vaccine. After that, you need a Td booster dose every 10 years.  HPV immunization. / 3 doses over 6 months, if you are 34 and younger.  Measles, mumps, rubella (MMR) immunization. / You need at least 1 dose of MMR if you were born in 1957 or later. You may also need a second dose.  Meningococcal immunization. / 1 dose if you are age 77 to 44 and a first-year college student living in a residence hall, or have one of several medical conditions, you need to get vaccinated against meningococcal disease. You may also need additional booster doses.  Varicella immunization.** / Consult your caregiver.  Hepatitis A immunization.** / Consult your caregiver. 2 doses, 6 to 18 months apart.  Hepatitis B immunization.** / Consult  your caregiver. 3 doses usually over 6 months. Ages 73 to 41  Blood pressure check.** / Every 1 to 2 years.  Lipid and cholesterol check.** / Every 5 years beginning at age 5.  Clinical breast exam.** / Every year after age 74.  Mammogram.** / Every year beginning at age 17 and continuing for as long as you are in good health. Consult with your caregiver.  Pap test.** / Every 3 years starting at age 56 through age 53 or 81 with a history of 3 consecutive normal Pap tests.  HPV screening.** / Every 3 years from ages 37 through ages 25 to 38 with a history of 3 consecutive normal Pap tests.  Fecal occult blood test (FOBT) of stool. / Every year beginning at age 60 and continuing until age 66. You may not need to do this test if you get a colonoscopy every 10 years.  Flexible sigmoidoscopy or colonoscopy.** / Every 5 years for a flexible sigmoidoscopy or every 10 years for a colonoscopy beginning at age 46 and continuing until age 33.  Hepatitis C blood test.** / For all people born from [birth year] through 1965 and any individual with known risks for hepatitis C.  Skin self-exam. / Monthly.  Influenza immunization.** / Every year.  Pneumococcal polysaccharide immunization.** / 1 to 2 doses if you smoke cigarettes or if you have certain chronic medical conditions.  Tetanus, diphtheria, pertussis (Tdap, Td) immunization.** / A one-time dose of Tdap vaccine. After that, you need a Td booster dose every 10 years.  Measles, mumps, rubella (MMR) immunization. / You need at least 1 dose of MMR if you were born in 1957 or later. You may also need a second dose.  Varicella immunization.** / Consult your caregiver.  Meningococcal immunization.** / Consult your caregiver.  Hepatitis A immunization.** / Consult your caregiver. 2 doses, 6 to 18 months apart.  Hepatitis B immunization.** / Consult your caregiver. 3 doses, usually over 6 months. Ages 5 and over  Blood pressure check.** / Every 1 to  2 years.  Lipid and cholesterol check.** / Every 5 years beginning at age 71.  Clinical breast exam.** / Every year after age 42.  Mammogram.** / Every year beginning at age 55 and continuing for as long as you are in good health. Consult with your caregiver.  Pap test.** / Every 3 years starting at age 32 through age 63 or 65 with a 3 consecutive normal Pap tests. Testing can be stopped between 65 and 70 with 3 consecutive normal Pap tests and no abnormal Pap or HPV tests in the past 10 years.  HPV screening.** / Every 3 years from ages 24 through ages 3 or 66 with a history of 3 consecutive normal Pap tests. Testing can be stopped between 65 and 70 with 3 consecutive normal Pap tests and no abnormal Pap or HPV tests in the past 10 years.  Fecal occult blood test (FOBT) of stool. / Every year beginning at age 39 and continuing until age 73. You may not need to do this test if you get a colonoscopy every 10 years.  Flexible sigmoidoscopy or colonoscopy.** / Every 5 years for a flexible sigmoidoscopy or every 10 years for a colonoscopy beginning at age 47 and continuing until age 68.  Hepatitis C blood test.** / For all people born from [birth year] through 1965 and any individual  with known risks for hepatitis C.  Osteoporosis screening.** / A one-time screening for women ages 45 and over and women at risk for fractures or osteoporosis.  Skin self-exam. / Monthly.  Influenza immunization.** / Every year.  Pneumococcal polysaccharide immunization.** / 1 dose at age 24 (or older) if you have never been vaccinated.  Tetanus, diphtheria, pertussis (Tdap, Td) immunization. / A one-time dose of Tdap vaccine if you are over 65 and have contact with an infant, are a Research scientist (physical sciences), or simply want to be protected from whooping cough. After that, you need a Td booster dose every 10 years.  Varicella immunization.** / Consult your caregiver.  Meningococcal immunization.** / Consult your  caregiver.  Hepatitis A immunization.** / Consult your caregiver. 2 doses, 6 to 18 months apart.  Hepatitis B immunization.** / Check with your caregiver. 3 doses, usually over 6 months. ** Family history and personal history of risk and conditions may change your caregiver's recommendations. Document Released: 05/10/2001 Document Revised: 06/06/2011 Document Reviewed: 08/09/2010 [facility] Patient Information 2013 [place], [place].

## 2012-05-13 ENCOUNTER — Other Ambulatory Visit: Payer: Self-pay

## 2012-08-17 ENCOUNTER — Encounter: Payer: Self-pay | Admitting: Internal Medicine

## 2013-01-31 ENCOUNTER — Other Ambulatory Visit: Payer: Self-pay

## 2013-01-31 LAB — HM MAMMOGRAPHY

## 2013-02-01 ENCOUNTER — Encounter: Payer: Self-pay | Admitting: Internal Medicine

## 2013-02-08 ENCOUNTER — Encounter: Payer: Self-pay | Admitting: Internal Medicine

## 2014-01-01 ENCOUNTER — Other Ambulatory Visit (INDEPENDENT_AMBULATORY_CARE_PROVIDER_SITE_OTHER): Payer: BC Managed Care – PPO

## 2014-01-01 DIAGNOSIS — Z0000 Encounter for general adult medical examination without abnormal findings: Secondary | ICD-10-CM

## 2014-01-01 LAB — CBC WITH DIFFERENTIAL/PLATELET
BASOS ABS: 0 10*3/uL (ref 0.0–0.1)
Basophils Relative: 0.4 % (ref 0.0–3.0)
EOS ABS: 0.1 10*3/uL (ref 0.0–0.7)
Eosinophils Relative: 1.9 % (ref 0.0–5.0)
HCT: 36 % (ref 36.0–46.0)
HEMOGLOBIN: 11.9 g/dL — AB (ref 12.0–15.0)
LYMPHS PCT: 41.1 % (ref 12.0–46.0)
Lymphs Abs: 2.4 10*3/uL (ref 0.7–4.0)
MCHC: 33.1 g/dL (ref 30.0–36.0)
MCV: 93.5 fl (ref 78.0–100.0)
Monocytes Absolute: 0.4 10*3/uL (ref 0.1–1.0)
Monocytes Relative: 5.9 % (ref 3.0–12.0)
NEUTROS ABS: 3 10*3/uL (ref 1.4–7.7)
NEUTROS PCT: 50.7 % (ref 43.0–77.0)
Platelets: 229 10*3/uL (ref 150.0–400.0)
RBC: 3.85 Mil/uL — ABNORMAL LOW (ref 3.87–5.11)
RDW: 12.6 % (ref 11.5–15.5)
WBC: 5.9 10*3/uL (ref 4.0–10.5)

## 2014-01-01 LAB — HEPATIC FUNCTION PANEL
ALT: 13 U/L (ref 0–35)
AST: 21 U/L (ref 0–37)
Albumin: 3.6 g/dL (ref 3.5–5.2)
Alkaline Phosphatase: 42 U/L (ref 39–117)
BILIRUBIN DIRECT: 0.1 mg/dL (ref 0.0–0.3)
BILIRUBIN TOTAL: 0.8 mg/dL (ref 0.2–1.2)
Total Protein: 7.2 g/dL (ref 6.0–8.3)

## 2014-01-01 LAB — LIPID PANEL
CHOL/HDL RATIO: 4
Cholesterol: 182 mg/dL (ref 0–200)
HDL: 47.8 mg/dL (ref >39.00)
LDL CALC: 111 mg/dL — AB (ref 0–99)
NonHDL: 134.2
Triglycerides: 116 mg/dL (ref 0.0–149.0)
VLDL: 23.2 mg/dL (ref 0.0–40.0)

## 2014-01-01 LAB — BASIC METABOLIC PANEL
BUN: 16 mg/dL (ref 6–23)
CALCIUM: 8.9 mg/dL (ref 8.4–10.5)
CO2: 23 meq/L (ref 19–32)
CREATININE: 0.6 mg/dL (ref 0.4–1.2)
Chloride: 103 mEq/L (ref 96–112)
GFR: 115.71 mL/min (ref >60.00)
GLUCOSE: 83 mg/dL (ref 70–99)
Potassium: 4.5 mEq/L (ref 3.5–5.1)
Sodium: 136 mEq/L (ref 135–145)

## 2014-01-01 LAB — TSH: TSH: 3.13 u[IU]/mL (ref 0.35–4.50)

## 2014-01-08 ENCOUNTER — Ambulatory Visit (INDEPENDENT_AMBULATORY_CARE_PROVIDER_SITE_OTHER): Payer: BC Managed Care – PPO | Admitting: Internal Medicine

## 2014-01-08 ENCOUNTER — Encounter: Payer: Self-pay | Admitting: Internal Medicine

## 2014-01-08 VITALS — BP 102/68 | Temp 97.9°F | Ht 62.25 in | Wt 126.8 lb

## 2014-01-08 DIAGNOSIS — D649 Anemia, unspecified: Secondary | ICD-10-CM

## 2014-01-08 DIAGNOSIS — Z23 Encounter for immunization: Secondary | ICD-10-CM

## 2014-01-08 DIAGNOSIS — Z0000 Encounter for general adult medical examination without abnormal findings: Secondary | ICD-10-CM

## 2014-01-08 NOTE — Progress Notes (Signed)
Pre visit review using our clinic review tool, if applicable. No additional management support is needed unless otherwise documented below in the visit note.  Chief Complaint  Patient presents with  . Annual Exam    HPI: Patient comes in today for Preventive Health Care visit  No major change in her health status since last visit. She sees Dr. Herbert Deaner IOP glaucoma suspect. Exercising regularly no current chest pain shortness of breath. Sees GYN on a regular basis. Still having periods  Health Maintenance  Topic Date Due  . Mammogram  01/31/2014  . Influenza Vaccine  10/27/2014  . Pap Smear  12/27/2015  . Tetanus/tdap  04/19/2019   Health Maintenance Review LIFESTYLE:  Exercise:   Yes  Tobacco/ETS:  Alcohol: per day  Sugar beverages: Sleep: 7 hours  Drug use: no PAP: Up-to-date   ROS: Sometimes gets a neck pulling feeling on her left side not related to exercise to be stiff in the morning no numbness weakness. Neg stress test 1 15  GEN/ HEENT: No fever, significant weight changes sweats headaches vision problems hearing changes, CV/ PULM; No chest pain shortness of breath cough, syncope,edema  change in exercise tolerance. GI /GU: No adominal pain, vomiting, change in bowel habits. No blood in the stool. No significant GU symptoms. SKIN/HEME: ,no acute skin rashes suspicious lesions or bleeding. No lymphadenopathy, nodules, masses.  NEURO/ PSYCH:  No neurologic signs such as weakness numbness. No depression anxiety. IMM/ Allergy: No unusual infections.  Allergy .   REST of 12 system review negative except as per HPI   Past Medical History  Diagnosis Date  . Elevated IOP     watch for glaucoma  . Abnormal findings on diagnostic imaging of gall bladder     low EF  . Normal cardiac stress test     1 2015    Family History  Problem Relation Age of Onset  . Hypertension Mother   . Healthy Daughter   . Healthy Daughter   . Breast cancer      GParent  . Heart attack       [facility] [employer]    History   Social History  . Marital Status: Married    Spouse Name: N/A    Number of Children: N/A  . Years of Education: N/A   Social History Main Topics  . Smoking status: Never Smoker   . Smokeless tobacco: Never Used  . Alcohol Use: No  . Drug Use: None  . Sexual Activity: None   Other Topics Concern  . None   Social History Narrative   H H  of  3-4 daughter college   Married  Masters education  homemaker   Husband works for [employer]  Smoke alarm seat belts no ets.    Moved from [place] P[place]   No pets.   Exercise  At least 3 x per week.       Caffiene; diet coke .     Outpatient Encounter Prescriptions as of 01/08/2014  Medication Sig  . clindamycin-benzoyl peroxide (BENZACLIN) gel Apply to affected area 2 times daily for adult acne    EXAM:  BP 102/68  Temp(Src) 97.9 F (36.6 C) (Oral)  Ht 5' 2.25" (1.581 m)  Wt 126 lb 12.8 oz (57.516 kg)  BMI 23.01 kg/m2  LMP 01/01/2014  Body mass index is 23.01 kg/(m^2).  Physical Exam: Vital signs reviewed CBJ:SEGB is a well-developed well-nourished alert cooperative    who appearsr  stated age in no acute distress.  HEENT: normocephalic atraumatic , Eyes: PERRL EOM's full, conjunctiva clear, Nares: paten,t no deformity discharge or tenderness., Ears: no deformity EAC's clear TMs with normal landmarks. Mouth: clear OP, no lesions, edema.  Moist mucous membranes. Dentition in adequate repair. NECK: supple without masses, thyromegaly or bruits. CHEST/PULM:  Clear to auscultation and percussion breath sounds equal no wheeze , rales or rhonchi. No chest wall deformities or tenderness. Breast: normal by inspection . No dimpling, discharge, masses, tenderness or discharge . CV: PMI is nondisplaced, S1 S2 no gallops, murmurs, rubs. Peripheral pulses are full without delay.No JVD .  ABDOMEN: Bowel sounds normal nontender  No guard or rebound, no hepato splenomegal no CVA tenderness.  No  hernia. Extremtities:  No clubbing cyanosis or edema, no acute joint swelling or redness no focal atrophy NEURO:  Oriented x3, cranial nerves 3-12 appear to be intact, no obvious focal weakness,gait within normal limits no abnormal reflexes or asymmetrical SKIN: No acute rashes normal turgor, color, no bruising or petechiae. PSYCH: Oriented, good eye contact, no obvious depression anxiety, cognition and judgment appear normal. LN: no cervical axillary inguinal adenopathy  Lab Results  Component Value Date   WBC 5.9 01/01/2014   HGB 11.9* 01/01/2014   HCT 36.0 01/01/2014   PLT 229.0 01/01/2014   GLUCOSE 83 01/01/2014   CHOL 182 01/01/2014   TRIG 116.0 01/01/2014   HDL 47.80 01/01/2014   LDLCALC 111* 01/01/2014   ALT 13 01/01/2014   AST 21 01/01/2014   NA 136 01/01/2014   K 4.5 01/01/2014   CL 103 01/01/2014   CREATININE 0.6 01/01/2014   BUN 16 01/01/2014   CO2 23 01/01/2014   TSH 3.13 01/01/2014    ASSESSMENT AND PLAN:  Discussed the following assessment and plan:  Visit for preventive health examination  Encounter for immunization  Mild anemia - about the same prob iron defic  inc iron and follow ora sneeded  Patient Care Team: Burnis Medin, MD as PCP - General (Internal Medicine) Elveria Royals, MD as Attending Physician (Obstetrics and Gynecology) Juanita Craver, MD as Attending Physician (Gastroenterology) Laverda Page, MD as Consulting Physician (Cardiology) Patient Instructions   Continue life style intervention for cholesterol control and health. mild anemia prob midl iron deficiency from periods .  Can take iron supplement and or iron rich foods   . Check blood count yearly or   As needed  If extra tired .    Healthy lifestyle includes : At least 150 minutes of exercise weeks  , weight at healthy levels, which is usually   BMI 19-25. Avoid trans fats and processed foods;  Increase fresh fruits and veges to 5 servings per day. And avoid sweet beverages including tea and  juice. Mediterranean diet with olive oil and nuts have been noted to be heart and brain healthy . Avoid tobacco products . Limit  alcohol   Get adequate sleep . Wear seat belts . Don't text and drive .       Standley Brooking. Sonika Levins M.D.

## 2014-01-08 NOTE — Patient Instructions (Signed)
  Continue life style intervention for cholesterol control and health. mild anemia prob midl iron deficiency from periods .  Can take iron supplement and or iron rich foods   . Check blood count yearly or   As needed  If extra tired .    Healthy lifestyle includes : At least 150 minutes of exercise weeks  , weight at healthy levels, which is usually   BMI 19-25. Avoid trans fats and processed foods;  Increase fresh fruits and veges to 5 servings per day. And avoid sweet beverages including tea and juice. Mediterranean diet with olive oil and nuts have been noted to be heart and brain healthy . Avoid tobacco products . Limit  alcohol   Get adequate sleep . Wear seat belts . Don't text and drive .

## 2014-05-22 ENCOUNTER — Encounter: Payer: Self-pay | Admitting: Internal Medicine

## 2014-05-22 ENCOUNTER — Ambulatory Visit
Admission: RE | Admit: 2014-05-22 | Discharge: 2014-05-22 | Disposition: A | Discharge status: Home / Self Care | Payer: BLUE CROSS/BLUE SHIELD | Source: Ambulatory Visit | Attending: Internal Medicine | Admitting: Internal Medicine

## 2014-05-22 ENCOUNTER — Telehealth: Payer: Self-pay | Admitting: Internal Medicine

## 2014-05-22 ENCOUNTER — Ambulatory Visit (INDEPENDENT_AMBULATORY_CARE_PROVIDER_SITE_OTHER): Payer: BLUE CROSS/BLUE SHIELD | Admitting: Internal Medicine

## 2014-05-22 ENCOUNTER — Other Ambulatory Visit: Payer: Self-pay

## 2014-05-22 VITALS — BP 110/70 | Temp 98.5°F | Wt 126.3 lb

## 2014-05-22 DIAGNOSIS — R319 Hematuria, unspecified: Secondary | ICD-10-CM

## 2014-05-22 DIAGNOSIS — R109 Unspecified abdominal pain: Secondary | ICD-10-CM

## 2014-05-22 DIAGNOSIS — M549 Dorsalgia, unspecified: Secondary | ICD-10-CM

## 2014-05-22 LAB — POCT URINALYSIS DIPSTICK
Bilirubin, UA: NEGATIVE
Blood, UA: 2
GLUCOSE UA: NEGATIVE
Ketones, UA: NEGATIVE
Leukocytes, UA: NEGATIVE
NITRITE UA: NEGATIVE
Protein, UA: NEGATIVE
Spec Grav, UA: 1.015
UROBILINOGEN UA: 0.2
pH, UA: 5.5

## 2014-05-22 MED ORDER — CYCLOBENZAPRINE HCL 5 MG PO TABS: 5.0000 mg | ORAL_TABLET | Freq: Three times a day (TID) | ORAL | Status: DC | PRN

## 2014-05-22 NOTE — Telephone Encounter (Signed)
[facility] Day - Client [place] [facility] Patient Name: Katie Lawson I DOB: 08-Nov-1965 Initial Comment Caller states she has been having a stiffness in her back. Lower back pain. Nurse Assessment Nurse: Mechele Dawley, RN, Amy Date/Time Eilene Ghazi Time): 05/22/2014 9:38:10 AM Confirm and document reason for call. If symptomatic, describe symptoms. ---PATIENT STATES THAT HER PAIN STARTED 2 DAYS AGO STARTED WITH LOWER BACK PAIN. NO NEW INJURIES. PAIN IS NOT BAD AT ALL 3/10. SHE STATES THAT SHE IS UNCOMFORTABLE. SHE IS NOT ABLE TO STAND UP RIGHT. SHE STATES SHE JUST FEELS [place]. NO NUMBNESS OR TINGLING. SHE HAS BEEN TAKING IBUPROFEN. WHEN SHE IS SITTING IT FEELS A LITTLE BETTER, WALKING MAKES IT WORSE. Has the patient traveled out of the country within the last 30 days? ---Not Applicable Does the patient require triage? ---Yes Related visit to physician within the last 2 weeks? ---No Does the PT have any chronic conditions? (i.e. diabetes, asthma, etc.) ---No Did the patient indicate they were pregnant? ---No Guidelines Guideline Title Affirmed Question Affirmed Notes Flank Pain MODERATE pain (e.g., interferes with normal activities or awakens from sleep) Final Disposition User See Physician within [place], Therapist, sports, Amy Comments SCHEDULED APPT TODAY AT [place].

## 2014-05-22 NOTE — Progress Notes (Signed)
Pre visit review using our clinic review tool, if applicable. No additional management support is needed unless otherwise documented below in the visit note.  Chief Complaint  Patient presents with  . Left Side lower back pain    HPI: Patient Katie Lawson  comes in today for SDA for  new problem evaluation. Sent in  By triage  See phone note  Onset 2 days of discomfort unusual for her  Without incitores  Stiffness  And uncomfortable to walk .  Onset having  dinner  And walking to dinner.  Pain not that terrible .   But very stiff  hard to walk has to bend over  No radiation to abd . Remote hx of lifting back pain  And on and off problems but this is different.  ROS: See pertinent positives and negatives per HPI. No fever chills  Dysuria  lmp 2  weeks ago  No vag bleeding   Husband has vascectomy. No trauma fam hx of blood injury stones . No nvd .  Has lump left upper thight no sx but please confirm not a problem no neuro sx   Past Medical History  Diagnosis Date  . Elevated IOP     watch for glaucoma  . Abnormal findings on diagnostic imaging of gall bladder     low EF  . Normal cardiac stress test     1 2015    Family History  Problem Relation Age of Onset  . Hypertension Mother   . Healthy Daughter   . Healthy Daughter   . Breast cancer      GParent  . Heart attack      mgf muncle    History   Social History  . Marital Status: Married    Spouse Name: N/A  . Number of Children: N/A  . Years of Education: N/A   Social History Main Topics  . Smoking status: Never Smoker   . Smokeless tobacco: Never Used  . Alcohol Use: No  . Drug Use: Not on file  . Sexual Activity: Not on file   Other Topics Concern  . Not on file   Social History Narrative   H H  of  3-4 daughter college   Married  Masters education  homemaker   Husband works for [employer]  Smoke alarm seat belts no ets.    Moved from [place] P2   No pets.   Exercise  At least 3  x per week.       Caffiene; diet coke .     Outpatient Encounter Prescriptions as of 05/22/2014  Medication Sig  . clindamycin-benzoyl peroxide (BENZACLIN) gel Apply to affected area 2 times daily for adult acne  . cyclobenzaprine (FLEXERIL) 5 MG tablet Take 1 tablet (5 mg total) by mouth 3 (three) times daily as needed for muscle spasms.    EXAM:  BP 110/70 mmHg  Temp(Src) 98.5 F (36.9 C) (Oral)  Wt 126 lb 4.8 oz (57.289 kg)  Body mass index is 22.92 kg/(m^2).  GENERAL: vitals reviewed and listed above, alert, oriented, appears well hydrated and in no acute distress HEENT: atraumatic, conjunctiva  clear, no obvious abnormalities on inspection of external nose and ears   NECK: no obvious masses on inspection palpation  LUNGS: clear to auscultation bilaterally, no wheezes, rales or rhonchi, good air movement Back left flank tenderness and radiation to left ls area no pont tenderness  No mid line tenderness  Gait antalgic has to bend forward  To be comfortable  Abdomen:  Sof,t normal bowel sounds without hepatosplenomegaly, no guarding rebound or masses no CVA tenderness Left upper thigh t a 3 cm superficial soft lump prob lipoma  CV: HRRR, no clubbing cyanosis or  peripheral edema nl cap refill  MS: moves all extremities without noticeable focal  Abnormality otherwise  Neuro non focal  PSYCH: pleasant and cooperative, no obvious depression or anxiety UA 2+ blood ( no vaginal blood) neg wbcs.  ASSESSMENT AND PLAN:  Discussed the following assessment and plan:  Back pain, unspecified location - Plan: POC Urinalysis Dipstick, US Renal, Culture, Urine  Left flank discomfort - Plan: US Renal, Culture, Urine  Blood in urine - Plan: US Renal, Culture, Urine Acts ms but [place] is abnormal  Renal US  And close fu advised .  Fu   ua etc.   -Patient advised to return or notify health care team  if symptoms worsen ,persist or new concerns arise.  Patient Instructions  Probably  musculoskeletal cause but because of the abnormal blood screen in urine for no obvious  Reason.  Will do culture for infection ( doubt)   And arrange a renal ultrasound  . If all ok plan  fu in 2 weeks or so and repeat urine . Can use aleve and muscle relaxant in the interim .  Back Pain, Adult Low back pain is very common. About 1 in 5 people have back pain.The cause of low back pain is rarely dangerous. The pain often gets better over time.About half of people with a sudden onset of back pain feel better in just 2 weeks. About 8 in 10 people feel better by 6 weeks.  CAUSES Some common causes of back pain include:  Strain of the muscles or ligaments supporting the spine.  Wear and tear (degeneration) of the spinal discs.  Arthritis.  Direct injury to the back. DIAGNOSIS Most of the time, the direct cause of low back pain is not known.However, back pain can be treated effectively even when the exact cause of the pain is unknown.Answering your caregiver's questions about your overall health and symptoms is one of the most accurate ways to make sure the cause of your pain is not dangerous. If your caregiver needs more information, he or she may order lab work or imaging tests (X-rays or MRIs).However, even if imaging tests show changes in your back, this usually does not require surgery. HOME CARE INSTRUCTIONS For many people, back pain returns.Since low back pain is rarely dangerous, it is often a condition that people can learn to [facility] their own.   Remain active. It is stressful on the back to sit or stand in one place. Do not sit, drive, or stand in one place for more than 30 minutes at a time. Take short walks on level surfaces as soon as pain allows.Try to increase the length of time you walk each day.  Do not stay in bed.Resting more than 1 or 2 days can delay your recovery.  Do not avoid exercise or work.Your body is made to move.It is not dangerous to be active, even  though your back may hurt.Your back will likely heal faster if you return to being active before your pain is gone.  Pay attention to your body when you bend and lift. Many people have less discomfortwhen lifting if they bend their knees, keep the load close to their bodies,and avoid twisting. Often, the most comfortable positions are  those that put less stress on your recovering back.  Find a comfortable position to sleep. Use a firm mattress and lie on your side with your knees slightly bent. If you lie on your back, put a pillow under your knees.  Only take over-the-counter or prescription medicines as directed by your caregiver. Over-the-counter medicines to reduce pain and inflammation are often the most helpful.Your caregiver may prescribe muscle relaxant drugs.These medicines help dull your pain so you can more quickly return to your normal activities and healthy exercise.  Put ice on the injured area.  Put ice in a plastic bag.  Place a towel between your skin and the bag.  Leave the ice on for 15-20 minutes, 03-04 times a day for the first 2 to 3 days. After that, ice and heat may be alternated to reduce pain and spasms.  Ask your caregiver about trying back exercises and gentle massage. This may be of some benefit.  Avoid feeling anxious or stressed.Stress increases muscle tension and can worsen back pain.It is important to recognize when you are anxious or stressed and learn ways to manage it.Exercise is a great option. SEEK MEDICAL CARE IF:  You have pain that is not relieved with rest or medicine.  You have pain that does not improve in 1 week.  You have new symptoms.  You are generally not feeling well. SEEK IMMEDIATE MEDICAL CARE IF:   You have pain that radiates from your back into your legs.  You develop new bowel or bladder control problems.  You have unusual weakness or numbness in your arms or legs.  You develop nausea or vomiting.  You develop  abdominal pain.  You feel faint. Document Released: 03/14/2005 Document Revised: 09/13/2011 Document Reviewed: 07/16/2013 [facility] Patient Information 2015 [place], [place]. This information is not intended to replace advice given to you by your health care provider. Make sure you discuss any questions you have with your health care provider.      Standley Brooking. Panosh M.D.

## 2014-05-22 NOTE — Patient Instructions (Signed)
Probably musculoskeletal cause but because of the abnormal blood screen in urine for no obvious  Reason.  Will do culture for infection ( doubt)   And arrange a renal ultrasound  . If all ok plan  fu in 2 weeks or so and repeat urine . Can use aleve and muscle relaxant in the interim .  Back Pain, Adult Low back pain is very common. About 1 in 5 people have back pain.The cause of low back pain is rarely dangerous. The pain often gets better over time.About half of people with a sudden onset of back pain feel better in just 2 weeks. About 8 in 10 people feel better by 6 weeks.  CAUSES Some common causes of back pain include:  Strain of the muscles or ligaments supporting the spine.  Wear and tear (degeneration) of the spinal discs.  Arthritis.  Direct injury to the back. DIAGNOSIS Most of the time, the direct cause of low back pain is not known.However, back pain can be treated effectively even when the exact cause of the pain is unknown.Answering your caregiver's questions about your overall health and symptoms is one of the most accurate ways to make sure the cause of your pain is not dangerous. If your caregiver needs more information, he or she may order lab work or imaging tests (X-rays or MRIs).However, even if imaging tests show changes in your back, this usually does not require surgery. HOME CARE INSTRUCTIONS For many people, back pain returns.Since low back pain is rarely dangerous, it is often a condition that people can learn to [facility] their own.   Remain active. It is stressful on the back to sit or stand in one place. Do not sit, drive, or stand in one place for more than 30 minutes at a time. Take short walks on level surfaces as soon as pain allows.Try to increase the length of time you walk each day.  Do not stay in bed.Resting more than 1 or 2 days can delay your recovery.  Do not avoid exercise or work.Your body is made to move.It is not dangerous to be  active, even though your back may hurt.Your back will likely heal faster if you return to being active before your pain is gone.  Pay attention to your body when you bend and lift. Many people have less discomfortwhen lifting if they bend their knees, keep the load close to their bodies,and avoid twisting. Often, the most comfortable positions are those that put less stress on your recovering back.  Find a comfortable position to sleep. Use a firm mattress and lie on your side with your knees slightly bent. If you lie on your back, put a pillow under your knees.  Only take over-the-counter or prescription medicines as directed by your caregiver. Over-the-counter medicines to reduce pain and inflammation are often the most helpful.Your caregiver may prescribe muscle relaxant drugs.These medicines help dull your pain so you can more quickly return to your normal activities and healthy exercise.  Put ice on the injured area.  Put ice in a plastic bag.  Place a towel between your skin and the bag.  Leave the ice on for 15-20 minutes, 03-04 times a day for the first 2 to 3 days. After that, ice and heat may be alternated to reduce pain and spasms.  Ask your caregiver about trying back exercises and gentle massage. This may be of some benefit.  Avoid feeling anxious or stressed.Stress increases muscle tension and can worsen back pain.It  is important to recognize when you are anxious or stressed and learn ways to manage it.Exercise is a great option. SEEK MEDICAL CARE IF:  You have pain that is not relieved with rest or medicine.  You have pain that does not improve in 1 week.  You have new symptoms.  You are generally not feeling well. SEEK IMMEDIATE MEDICAL CARE IF:   You have pain that radiates from your back into your legs.  You develop new bowel or bladder control problems.  You have unusual weakness or numbness in your arms or legs.  You develop nausea or vomiting.  You  develop abdominal pain.  You feel faint. Document Released: 03/14/2005 Document Revised: 09/13/2011 Document Reviewed: 07/16/2013 [facility] Patient Information 2015 [place], [place]. This information is not intended to replace advice given to you by your health care provider. Make sure you discuss any questions you have with your health care provider.

## 2014-05-22 NOTE — Telephone Encounter (Signed)
Patient has appointment today to see PCP 

## 2014-05-23 ENCOUNTER — Other Ambulatory Visit: Payer: Self-pay | Admitting: Family Medicine

## 2014-05-23 ENCOUNTER — Telehealth: Payer: Self-pay | Admitting: Internal Medicine

## 2014-05-23 DIAGNOSIS — N200 Calculus of kidney: Secondary | ICD-10-CM

## 2014-05-23 NOTE — Telephone Encounter (Signed)
See result note.  

## 2014-05-23 NOTE — Telephone Encounter (Signed)
Noted  

## 2014-05-23 NOTE — Telephone Encounter (Signed)
Pt has stat ultrasound yesterday and would like results

## 2014-05-24 LAB — URINE CULTURE
Colony Count: NO GROWTH
Organism ID, Bacteria: NO GROWTH

## 2014-09-27 ENCOUNTER — Encounter: Payer: Self-pay | Admitting: Family Medicine

## 2014-09-27 ENCOUNTER — Ambulatory Visit (INDEPENDENT_AMBULATORY_CARE_PROVIDER_SITE_OTHER): Payer: BLUE CROSS/BLUE SHIELD | Admitting: Family Medicine

## 2014-09-27 VITALS — BP 104/74 | HR 69 | Temp 98.5°F | Ht 62.25 in | Wt 123.0 lb

## 2014-09-27 DIAGNOSIS — R220 Localized swelling, mass and lump, head: Secondary | ICD-10-CM | POA: Diagnosis not present

## 2014-09-27 DIAGNOSIS — J069 Acute upper respiratory infection, unspecified: Secondary | ICD-10-CM

## 2014-09-27 NOTE — Progress Notes (Signed)
Pre visit review using our clinic review tool, if applicable. No additional management support is needed unless otherwise documented below in the visit note. 

## 2014-09-27 NOTE — Progress Notes (Signed)
PCP: Lottie Dawson, MD  Subjective:  Katie Lawson is a 49 y.o. year old very pleasant female patient who presents with tongue swelling and URI symptoms  -For about 2 days, complains of sore throat, cough nonproducitive. Mild headache. Did travel to [place] recently and got back Thursday. Husband sick with very similar symptoms and patient did not get sick until exposed to him.   More concerning symptom was that last night woke up and felt like her tongue was swelling. She felt like it was somewhat hard to breath through her mouth though could breath through nose. She took a benadryl. Otherwise she ha snot taken any new medications, tried no new foods, and had no new contacts  ROS-no fever, chills, sweats  Pertinent Past Medical History- acne, anemia  Medications- reviewed  Current Outpatient Prescriptions  Medication Sig Dispense Refill  . clindamycin-benzoyl peroxide (BENZACLIN) gel Apply to affected area 2 times daily for adult acne 25 g 3   Objective: BP 104/74 mmHg  Pulse 69  Temp(Src) 98.5 F (36.9 C) (Oral)  Ht 5' 2.25" (1.581 m)  Wt 123 lb (55.792 kg)  BMI 22.32 kg/m2  SpO2 97%  LMP 08/28/2014 Gen: NAD, resting comfortably HEENT: Turbinates erythematous with some yellow discharge, TM normal, pharynx erythematous with no tonsilar exudate but some swelling, no sinus pressure, no cervical lymphadenopathy Tongue is slightly swollen with whitish appearance. No lip swelling.  CV: RRR no murmurs rubs or gallops Lungs: CTAB no crackles, wheeze, rhonchi Abdomen: soft/nontender/nondistended/normal bowel sounds. No rebound or guarding.  Ext: no edema Skin: warm, dry, no rash  Assessment/Plan:  URI- currently dealing with URI, she will use conservative home treatments but avoid medicine other than antihistamines Tongue swelling/glossitis- unclear cause other than reaction to virus she is dealing with as not taking any medicine, no new food exposures. Mild swelling  today but nothing like last night. We will be proactive for prevention and use zyrtec nightly and she will keep benadryl very close. We discussed very strict return/emergent precautions. Follow up with PCP this week.

## 2014-09-27 NOTE — Patient Instructions (Signed)
Upper respiratory infection  Wonder if reaction to virus caused your tongue swelling  Let's be proactive with zyrtec or allegra nightly  Keep benadryl with you at all times  If you had recurrent symptoms not controlled with benadryl, seek care. If you had shortness of breath, seek care immediately.   I think it would be reasonable to check in with Dr. Regis Bill next week.

## 2015-06-03 NOTE — Progress Notes (Signed)
Pre visit review using our clinic review tool, if applicable. No additional management support is needed unless otherwise documented below in the visit note.  Chief Complaint  Patient presents with  . Cerumen Impaction    Bilateral ear wax, left low back pain recurrent , congestion    HPI: Katie Lawson 50 y.o. Patient Katie Lawson  comes in today for new problem evaluation.  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  What can be done no fever radiation or   Assoc sx no trauma ROS: See pertinent positives and negatives per HPI.  Past Medical History  Diagnosis Date  . Elevated IOP     watch for glaucoma  . Abnormal findings on diagnostic imaging of gall bladder     low EF  . Normal cardiac stress test     1 2015    Family History  Problem Relation Age of Onset  . Hypertension Mother   . Healthy Daughter   . Healthy Daughter   . Breast cancer      GParent  . Heart attack      mgf [employer]    Social History   Social History  . Marital Status: Married    Spouse Name: N/A  . Number of Children: N/A  . Years of Education: N/A   Social History Main Topics  . Smoking status: Never Smoker   . Smokeless tobacco: Never Used  . Alcohol Use: No  . Drug Use: None  . Sexual Activity: Not Asked   Other Topics Concern  . None   Social History Narrative   H H  of  3-4 daughter college   Married  Masters education  homemaker   Husband works for [employer]  Smoke alarm seat belts no ets.    Moved from [place] P2   No pets.   Exercise  At least 3 x per week.       Caffiene; diet coke .     Outpatient Prescriptions Prior to Visit  Medication Sig Dispense Refill  . clindamycin-benzoyl peroxide (BENZACLIN) gel Apply to  affected area 2 times daily for adult acne 25 g 3   No facility-administered medications prior to visit.     EXAM:  BP 102/78 mmHg  Temp(Src) 98 F (36.7 C) (Oral)  Wt 125 lb (56.7 kg)  Body mass index is 22.68 kg/(m^2).  GENERAL: vitals reviewed and listed above, alert, oriented, appears well hydrated and in no acute distress congested  Non toxic  HEENT: atraumatic, conjunctiva  clear, no obvious abnormalities on inspection of external nose and ears face non t ender   Right eac tm intact after irrigation clear  Left minimal wax left tm clear nl lm OP : no lesion edema or exudate  NECK: no obvious masses on inspection palpation  Back no midline tender  points to area llb pain of tightness   Nl gait today  MS: moves all extremities without noticeable focal  abnormality PSYCH: pleasant and cooperative, no obvious depression or anxiety  ASSESSMENT AND PLAN:  Discussed the following assessment and plan:  Post-nasal drainage  Eustachian tube dysfunction, bilateral  Excess wax in ear, bilateral  Low back pain without sciatica, unspecified back pain laterality - Plan: Ambulatory referral to Physical Therapy Add nasal steroid   Ear canal hygieine   Refer for pt  maintanece back ache    Expectant management.  -Patient advised to return or notify health care team  if symptoms worsen ,persist or new concerns arise.  Patient Instructions  Ears are not infected . Trial flonase or nasacort nose spray every day in allergyseason  .  May take 7- 10 days to see good effect .  Will be contacted about referral Physical therapy for back pain . dont use  q tips in ears      Katie Lawson M.D.

## 2015-06-04 ENCOUNTER — Ambulatory Visit (INDEPENDENT_AMBULATORY_CARE_PROVIDER_SITE_OTHER): Payer: BLUE CROSS/BLUE SHIELD | Admitting: Internal Medicine

## 2015-06-04 ENCOUNTER — Encounter: Payer: Self-pay | Admitting: Internal Medicine

## 2015-06-04 VITALS — BP 102/78 | Temp 98.0°F | Wt 125.0 lb

## 2015-06-04 DIAGNOSIS — H6123 Impacted cerumen, bilateral: Secondary | ICD-10-CM

## 2015-06-04 DIAGNOSIS — J329 Chronic sinusitis, unspecified: Secondary | ICD-10-CM

## 2015-06-04 DIAGNOSIS — M545 Low back pain: Secondary | ICD-10-CM | POA: Diagnosis not present

## 2015-06-04 DIAGNOSIS — H6983 Other specified disorders of Eustachian tube, bilateral: Secondary | ICD-10-CM | POA: Diagnosis not present

## 2015-06-04 DIAGNOSIS — R0982 Postnasal drip: Secondary | ICD-10-CM

## 2015-06-04 NOTE — Patient Instructions (Signed)
Ears are not infected . Trial flonase or nasacort nose spray every day in allergyseason  .  May take 7- 10 days to see good effect .  Will be contacted about referral Physical therapy for back pain . dont use  q tips in ears

## 2015-12-24 ENCOUNTER — Other Ambulatory Visit: Payer: Self-pay | Admitting: Gastroenterology

## 2015-12-24 DIAGNOSIS — Z1211 Encounter for screening for malignant neoplasm of colon: Secondary | ICD-10-CM | POA: Diagnosis not present

## 2015-12-24 DIAGNOSIS — R1013 Epigastric pain: Secondary | ICD-10-CM

## 2015-12-24 DIAGNOSIS — K828 Other specified diseases of gallbladder: Secondary | ICD-10-CM | POA: Diagnosis not present

## 2015-12-24 DIAGNOSIS — K219 Gastro-esophageal reflux disease without esophagitis: Secondary | ICD-10-CM | POA: Diagnosis not present

## 2015-12-25 LAB — BASIC METABOLIC PANEL
BUN: 12 mg/dL (ref 4–21)
CREATININE: 0.7 mg/dL (ref <=1.1)
Glucose: 88 mg/dL
POTASSIUM: 4 mmol/L (ref 3.4–5.3)

## 2015-12-25 LAB — CBC AND DIFFERENTIAL
HCT: 37 % (ref 36–46)
Platelets: 283 10*3/uL (ref 150–399)
WBC: 8.1 10^3/mL

## 2015-12-25 LAB — HEPATIC FUNCTION PANEL
ALK PHOS: 54 U/L (ref 25–125)
ALT: 14 U/L (ref 7–35)
AST: 14 U/L (ref 13–35)
BILIRUBIN, TOTAL: 0.5 mg/dL

## 2015-12-28 DIAGNOSIS — H04123 Dry eye syndrome of bilateral lacrimal glands: Secondary | ICD-10-CM | POA: Diagnosis not present

## 2015-12-28 DIAGNOSIS — H40023 Open angle with borderline findings, high risk, bilateral: Secondary | ICD-10-CM | POA: Diagnosis not present

## 2015-12-28 DIAGNOSIS — H40021 Open angle with borderline findings, high risk, right eye: Secondary | ICD-10-CM | POA: Diagnosis not present

## 2015-12-28 DIAGNOSIS — H40022 Open angle with borderline findings, high risk, left eye: Secondary | ICD-10-CM | POA: Diagnosis not present

## 2015-12-28 DIAGNOSIS — H25013 Cortical age-related cataract, bilateral: Secondary | ICD-10-CM | POA: Diagnosis not present

## 2015-12-28 DIAGNOSIS — H2513 Age-related nuclear cataract, bilateral: Secondary | ICD-10-CM | POA: Diagnosis not present

## 2016-01-04 ENCOUNTER — Ambulatory Visit (HOSPITAL_COMMUNITY)
Admission: RE | Admit: 2016-01-04 | Discharge: 2016-01-04 | Disposition: A | Discharge status: Home / Self Care | Payer: BLUE CROSS/BLUE SHIELD | Source: Ambulatory Visit | Attending: Gastroenterology | Admitting: Gastroenterology

## 2016-01-04 DIAGNOSIS — R1013 Epigastric pain: Secondary | ICD-10-CM | POA: Diagnosis not present

## 2016-01-04 DIAGNOSIS — K7689 Other specified diseases of liver: Secondary | ICD-10-CM | POA: Diagnosis not present

## 2016-01-04 MED ORDER — TECHNETIUM TC 99M MEBROFENIN IV KIT
5.1000 | PACK | Freq: Once | INTRAVENOUS | Status: AC | PRN
  Administered 2016-01-04: 5 via INTRAVENOUS

## 2016-01-12 ENCOUNTER — Encounter: Payer: Self-pay | Admitting: Family Medicine

## 2016-02-04 DIAGNOSIS — R110 Nausea: Secondary | ICD-10-CM | POA: Diagnosis not present

## 2016-02-04 DIAGNOSIS — K219 Gastro-esophageal reflux disease without esophagitis: Secondary | ICD-10-CM | POA: Diagnosis not present

## 2016-02-04 DIAGNOSIS — R1012 Left upper quadrant pain: Secondary | ICD-10-CM | POA: Diagnosis not present

## 2016-02-04 DIAGNOSIS — Z1211 Encounter for screening for malignant neoplasm of colon: Secondary | ICD-10-CM | POA: Diagnosis not present

## 2016-02-05 ENCOUNTER — Other Ambulatory Visit: Payer: Self-pay | Admitting: Gastroenterology

## 2016-02-05 DIAGNOSIS — R1032 Left lower quadrant pain: Secondary | ICD-10-CM

## 2016-02-17 ENCOUNTER — Ambulatory Visit
Admission: RE | Admit: 2016-02-17 | Discharge: 2016-02-17 | Disposition: A | Discharge status: Home / Self Care | Payer: BLUE CROSS/BLUE SHIELD | Source: Ambulatory Visit | Attending: Gastroenterology | Admitting: Gastroenterology

## 2016-02-17 DIAGNOSIS — R1032 Left lower quadrant pain: Secondary | ICD-10-CM

## 2016-02-17 DIAGNOSIS — K7689 Other specified diseases of liver: Secondary | ICD-10-CM | POA: Diagnosis not present

## 2016-02-17 MED ORDER — IOPAMIDOL (ISOVUE-300) INJECTION 61%
100.0000 mL | Freq: Once | INTRAVENOUS | Status: AC | PRN
  Administered 2016-02-17: 100 mL via INTRAVENOUS

## 2016-02-22 DIAGNOSIS — Z1389 Encounter for screening for other disorder: Secondary | ICD-10-CM | POA: Diagnosis not present

## 2016-02-22 DIAGNOSIS — Z13220 Encounter for screening for lipoid disorders: Secondary | ICD-10-CM | POA: Diagnosis not present

## 2016-02-22 DIAGNOSIS — Z01419 Encounter for gynecological examination (general) (routine) without abnormal findings: Secondary | ICD-10-CM | POA: Diagnosis not present

## 2016-02-22 DIAGNOSIS — Z131 Encounter for screening for diabetes mellitus: Secondary | ICD-10-CM | POA: Diagnosis not present

## 2016-02-22 DIAGNOSIS — Z6823 Body mass index (BMI) 23.0-23.9, adult: Secondary | ICD-10-CM | POA: Diagnosis not present

## 2016-03-07 DIAGNOSIS — D259 Leiomyoma of uterus, unspecified: Secondary | ICD-10-CM | POA: Diagnosis not present

## 2016-03-07 DIAGNOSIS — N920 Excessive and frequent menstruation with regular cycle: Secondary | ICD-10-CM | POA: Diagnosis not present

## 2016-03-14 DIAGNOSIS — K317 Polyp of stomach and duodenum: Secondary | ICD-10-CM | POA: Diagnosis not present

## 2016-03-14 DIAGNOSIS — D123 Benign neoplasm of transverse colon: Secondary | ICD-10-CM | POA: Diagnosis not present

## 2016-03-14 DIAGNOSIS — K635 Polyp of colon: Secondary | ICD-10-CM | POA: Diagnosis not present

## 2016-03-14 DIAGNOSIS — K2970 Gastritis, unspecified, without bleeding: Secondary | ICD-10-CM | POA: Diagnosis not present

## 2016-03-14 DIAGNOSIS — R1013 Epigastric pain: Secondary | ICD-10-CM | POA: Diagnosis not present

## 2016-03-14 DIAGNOSIS — K219 Gastro-esophageal reflux disease without esophagitis: Secondary | ICD-10-CM | POA: Diagnosis not present

## 2016-03-14 DIAGNOSIS — Z1211 Encounter for screening for malignant neoplasm of colon: Secondary | ICD-10-CM | POA: Diagnosis not present

## 2016-05-05 DIAGNOSIS — Z1231 Encounter for screening mammogram for malignant neoplasm of breast: Secondary | ICD-10-CM | POA: Diagnosis not present

## 2016-07-04 ENCOUNTER — Ambulatory Visit (INDEPENDENT_AMBULATORY_CARE_PROVIDER_SITE_OTHER): Payer: BLUE CROSS/BLUE SHIELD | Admitting: Internal Medicine

## 2016-07-04 ENCOUNTER — Encounter: Payer: Self-pay | Admitting: Internal Medicine

## 2016-07-04 VITALS — BP 104/82 | HR 82 | Temp 98.6°F | Ht 62.25 in | Wt 127.8 lb

## 2016-07-04 DIAGNOSIS — R1013 Epigastric pain: Secondary | ICD-10-CM | POA: Diagnosis not present

## 2016-07-04 LAB — HEPATIC FUNCTION PANEL
ALK PHOS: 47 U/L (ref 39–117)
ALT: 9 U/L (ref 0–35)
AST: 12 U/L (ref 0–37)
Albumin: 4.3 g/dL (ref 3.5–5.2)
BILIRUBIN DIRECT: 0.1 mg/dL (ref 0.0–0.3)
BILIRUBIN TOTAL: 0.5 mg/dL (ref 0.2–1.2)
Total Protein: 6.8 g/dL (ref 6.0–8.3)

## 2016-07-04 LAB — POCT URINALYSIS DIP (MANUAL ENTRY)
Bilirubin, UA: NEGATIVE
GLUCOSE UA: NEGATIVE
Ketones, POC UA: NEGATIVE
Leukocytes, UA: NEGATIVE
NITRITE UA: NEGATIVE
Protein Ur, POC: NEGATIVE
RBC UA: NEGATIVE
Spec Grav, UA: 1.015 (ref 1.030–1.035)
UROBILINOGEN UA: 0.2 (ref ?–2.0)
pH, UA: 6 (ref 5.0–8.0)

## 2016-07-04 LAB — AMYLASE: Amylase: 56 U/L (ref 27–131)

## 2016-07-04 LAB — LIPASE: LIPASE: 19 U/L (ref 11.0–59.0)

## 2016-07-04 NOTE — Patient Instructions (Addendum)
Will notify you  of labs when available.  Not sure cause   Sometimes  Amylase lipase are nonspecific findings but sometimes can be helpful in an acute situation and reassuring if they are normal.

## 2016-07-04 NOTE — Progress Notes (Signed)
Chief Complaint  Patient presents with  . Abdominal Cramping    scale 1-10 pain was a 5/     HPI: Katie Lawson 51 y.o.  SDA last ov a year ago for ear problem    \? Has gyne  stabale  This had episodic severe epigastric pain that lasts 45 minutes to hours over the last number of years.about 5  Used to be assoc with eating? But not now and always becoming more frequent  She has been evaluated by Dr. Collene Mares and others has had original ultrasound with low ejection fraction and when had increasing pain recently had a repeat scan. At that time was 70% ejection fraction but some question about the viability or accuracy of the test. Has had abd ct also has large fibroid.  In remote past  Had intrarenal stone   Non obstructing  She had an episode this morning around 645 lasted about 45 minutes severe epigastric pain somewhat going to the left radiates into the back mostly in the left no vomiting feels nauseous sides with and without medicine. A family member who is a gastroenterologist advised checking LFTs amylase lipase during the episode even though the pancreas looked okay on the CT scan. She has no fever has been given an antispasmodic in the past unsure if it helps.   ROS: See pertinent positives and negatives per HPI. No cp sob feer  Vomiting diarrhea    Past Medical History:  Diagnosis Date  . Abnormal findings on diagnostic imaging of gall bladder    low EF  . Elevated IOP    watch for glaucoma  . Fibroid, uterine    seen on ct  . Normal cardiac stress test    1 2015    Family History  Problem Relation Age of Onset  . Hypertension Mother   . Healthy Daughter   . Healthy Daughter   . Breast cancer      GParent  . Heart attack      [facility]    Social History   Social History  . Marital status: Married    Spouse name: N/A  . Number of children: N/A  . Years of education: N/A   Social History Main Topics  . Smoking status: Never Smoker  . Smokeless  tobacco: Never Used  . Alcohol use No  . Drug use: Unknown  . Sexual activity: Not Asked   Other Topics Concern  . None   Social History Narrative   H H  of  3-4 daughter college   Married  Masters education  homemaker   Husband works for [employer]  Smoke alarm seat belts no ets.    Moved from [place] P2   No pets.   Exercise  At least 3 x per week.       Caffiene; diet coke .     Outpatient Medications Prior to Visit  Medication Sig Dispense Refill  . clindamycin-benzoyl peroxide (BENZACLIN) gel Apply to affected area 2 times daily for adult acne 25 g 3   No facility-administered medications prior to visit.      EXAM:  BP 104/82 (BP Location: Left Arm, Patient Position: Sitting, Cuff Size: Normal)   Pulse 82   Temp 98.6 F (37 C) (Oral)   Ht 5' 2.25" (1.581 m)   Wt 127 lb 12.8 oz (58 kg)   LMP 02/08/2016 (Approximate)   BMI 23.19 kg/m   Body mass index is  23.19 kg/m.  GENERAL: vitals reviewed and listed above, alert, oriented, appears well hydrated and in no acute distress HEENT: atraumatic, conjunctiva  clear, no obvious abnormalities on inspection of external nose and ears NECK: no obvious masses on inspection palpation  LUNGS: clear to auscultation bilaterally, no wheezes, rales or rhonchi, good air movement CV: HRRR, no clubbing cyanosis or  peripheral edema nl cap refill  Abdomen:  Sof,t normal bowel sounds without hepatosplenomegaly, no guarding rebound or masses no CVA tenderness area of minimal tenderness is high epigastrium and left upper quadrant. No masses noted and no rebound MS: moves all extremities without noticeable focal  abnormality PSYCH: pleasant and cooperative, no obvious depression or anxiety  ASSESSMENT AND PLAN:  Discussed the following assessment and plan:  Recurrent epigastric abdominal pain - Plan: Hepatic function panel, Lipase, Amylase, POCT urinalysis dipstick Uncertain cause of pain does not seem GYN in character.  We'll go ahead and get laboratory studies today as discussed even though her pain is now gone as it is been few hours since it subsided. Consider talking with Dr. Collene Mares in seeing her when her pain is acute as convenient. Would be atypical for renal stone however she did have a stone on ultrasound a few years ago that was intrarenal. Check urinalysis today. -Patient advised to return or notify health care team  if symptoms worsen ,persist or new concerns arise.  Patient Instructions  Will notify you  of labs when available.  Not sure cause   Sometimes  Amylase lipase are nonspecific findings but sometimes can be helpful in an acute situation and reassuring if they are normal.       Mariann Laster K. Kimbely Whiteaker M.D.

## 2016-12-16 ENCOUNTER — Encounter: Payer: Self-pay | Admitting: Internal Medicine

## 2017-01-02 DIAGNOSIS — H40023 Open angle with borderline findings, high risk, bilateral: Secondary | ICD-10-CM | POA: Diagnosis not present

## 2017-01-02 DIAGNOSIS — H2513 Age-related nuclear cataract, bilateral: Secondary | ICD-10-CM | POA: Diagnosis not present

## 2017-01-02 DIAGNOSIS — H04123 Dry eye syndrome of bilateral lacrimal glands: Secondary | ICD-10-CM | POA: Diagnosis not present

## 2017-01-02 DIAGNOSIS — H25013 Cortical age-related cataract, bilateral: Secondary | ICD-10-CM | POA: Diagnosis not present

## 2017-01-16 ENCOUNTER — Encounter: Payer: BLUE CROSS/BLUE SHIELD | Admitting: Internal Medicine

## 2017-02-15 NOTE — Progress Notes (Signed)
Chief Complaint  Patient presents with  . Annual Exam    skin issues  . Follow-up    VIT D    HPI: Patient  Katie Lawson  51 y.o. comes in today for Preventive Health Care visit   Vit d weekly for a while.   Now on otc when remembers  ? How often  Hx of  Low vit d    And has been put on meds  For this  Ears clogged  ocass decongestant no fever   Health Maintenance  Topic Date Due  . HIV Screening  03/04/1981  . MAMMOGRAM  01/31/2014  . PAP SMEAR  12/27/2015  . COLONOSCOPY  03/04/2016  . INFLUENZA VACCINE  10/26/2016  . TETANUS/TDAP  04/19/2019   Health Maintenance Review LIFESTYLE:  Exercise:   Try 3 x per week.  Tobacco/ETS:no Alcohol:  no Sugar beverages:  Diet coke  Sleep: about 7  Drug use: no HH of  2 no pets  Work:  About  25 - 30  Per week.  Dr Collene Mares    My be utd . ? Polyp.     ROS:  Periods some off     Pigmentation   Other use.   asift feeling  Momentary  Ears    Stuff for  A year.  Blocked and hard to hear .  GEN/ HEENT: No fever, significant weight changes sweats headaches vision problems hearing changes, CV/ PULM; No chest pain shortness of breath cough, syncope,edema  change in exercise tolerance. GI /GU: No adominal pain, vomiting, change in bowel habits. No blood in the stool. No significant GU symptoms. SKIN/HEME: ,no acute skin rashes suspicious lesions or bleeding. No lymphadenopathy, nodules, masses.  NEURO/ PSYCH:  No neurologic signs such as weakness numbness. No depression anxiety. IMM/ Allergy: No unusual infections.  Allergy .   REST of 12 system review negative except as per HPI   Past Medical History:  Diagnosis Date  . Abnormal findings on diagnostic imaging of gall bladder    low EF  . Elevated IOP    watch for glaucoma  . Fibroid, uterine    seen on ct  . Normal cardiac stress test    1 2015    No past surgical history on file.  Family History  Problem Relation Age of Onset  . Hypertension Mother   . Healthy  Daughter   . Healthy Daughter   . Breast cancer Unknown        GParent  . Heart attack Unknown        [facility] [employer]    Social History   Socioeconomic History  . Marital status: Married    Spouse name: None  . Number of children: None  . Years of education: None  . Highest education level: None  Social Needs  . Financial resource strain: None  . Food insecurity - worry: None  . Food insecurity - inability: None  . Transportation needs - medical: None  . Transportation needs - non-medical: None  Occupational History  . None  Tobacco Use  . Smoking status: Never Smoker  . Smokeless tobacco: Never Used  Substance and Sexual Activity  . Alcohol use: No  . Drug use: None  . Sexual activity: None  Other Topics Concern  . None  Social History Narrative   H H  of  3-4 daughter college   Married  Masters education  homemaker   Husband works for [employer]  Smoke alarm seat belts no ets.    Moved from [place] P2   No pets.   Exercise  At least 3 x per week.       Caffiene; diet coke .     Outpatient Medications Prior to Visit  Medication Sig Dispense Refill  . clindamycin-benzoyl peroxide (BENZACLIN) gel Apply to affected area 2 times daily for adult acne 25 g 3  . Vitamin D, Ergocalciferol, (DRISDOL) 50000 units CAPS capsule      No facility-administered medications prior to visit.      EXAM:  BP 108/68   Pulse 72   Temp 98.2 F (36.8 C) (Oral)   Ht 5' 2.25" (1.581 m)   Wt 128 lb 9.6 oz (58.3 kg)   LMP 02/08/2017 (Exact Date)   SpO2 97%   BMI 23.33 kg/m   Body mass index is 23.33 kg/m. Wt Readings from Last 3 Encounters:  02/20/17 128 lb 9.6 oz (58.3 kg)  07/04/16 127 lb 12.8 oz (58 kg)  06/04/15 125 lb (56.7 kg)    Physical Exam: Vital signs reviewed TFT:DDUK is a well-developed well-nourished alert cooperative    who appearsr stated age in no acute distress.  HEENT: normocephalic atraumatic , Eyes: PERRL EOM's full, conjunctiva clear,  Nares: paten,t no deformity discharge or tenderness., Ears: no deformity EAC's  With wzx bilaterraly  After irrigation  clear TMs with normal landmarks. Mouth: clear OP, no lesions, edema.  Moist mucous membranes. Dentition in adequate repair. NECK: supple without masses, thyromegaly or bruits. ppints to left scm area feels tight but I dont feel a mass there  CHEST/PULM:  Clear to auscultation and percussion breath sounds equal no wheeze , rales or rhonchi. No chest wall deformities or tenderness. Breast: normal by inspection .  To be done by gyne CV: PMI is nondisplaced, S1 S2 no gallops, murmurs, rubs. Peripheral pulses are full without delay.No JVD .  ABDOMEN: Bowel sounds normal nontender  No guard or rebound, no hepato splenomegal no CVA tenderness.  No hernia. Extremtities:  No clubbing cyanosis or edema, no acute joint swelling or redness no focal atrophy NEURO:  Oriented x3, cranial nerves 3-12 appear to be intact, no obvious focal weakness,gait within normal limits no abnormal reflexes or asymmetrical SKIN: No acute rashes normal turgor, , no bruising or petechiae.  Poss lipoma left upper thigh Face with hyperpigment  Like melasma  On face  PSYCH: Oriented, good eye contact, no obvious depression anxiety, cognition and judgment appear normal. LN: no cervical axillary inguinal adenopathy    BP Readings from Last 3 Encounters:  02/20/17 108/68  07/04/16 104/82  06/04/15 102/78    Lab results reviewed with patient   ASSESSMENT AND PLAN:  Discussed the following assessment and plan:  Visit for preventive health examination - Plan: Basic metabolic panel, CBC with Differential/Platelet, Hepatic function panel, Lipid panel, TSH, VITAMIN D 25 Hydroxy (Vit-D Deficiency, Fractures)  Need for immunization against influenza - Plan: Flu Vaccine QUAD 36+ mos IM  Low vitamin D level - Plan: Basic metabolic panel, CBC with Differential/Platelet, Hepatic function panel, Lipid panel, TSH, VITAMIN  D 25 Hydroxy (Vit-D Deficiency, Fractures)  Wax in ear  Discoloration of skin - Plan: Basic metabolic panel, CBC with Differential/Platelet, Hepatic function panel, Lipid panel, TSH, VITAMIN D 25 Hydroxy (Vit-D Deficiency, Fractures)  Eustachian tube dysfunction, bilateral Perimenopausal    Sees gyne   hsa gi also   Disc preventive parameters.  Preventive parameters disc  Add incs  trail   for ear sx   As tolerated   Patient Care Team: Panosh, Standley Brooking, MD as PCP - General (Internal Medicine) Azucena Fallen, MD as Attending Physician (Obstetrics and Gynecology) Juanita Craver, MD as Attending Physician (Gastroenterology) Adrian Prows, MD as Consulting Physician (Cardiology) Patient Instructions  See dermatology . About the skin pigmentation  Will notify you  of labs when available.  Vit d 800 - 1000 iu per day   In supp or foods   Can try daily nasal cortisone such as flonase  Every day for 2-3 weeks to see if helps the ear sx .   Wax in ear is normal but sometimes can cause a sx if  Stopping up the canal.      Preventive Care 40-64 Years, Female Preventive care refers to lifestyle choices and visits with your health care provider that can promote health and wellness. What does preventive care include?  A yearly physical exam. This is also called an annual well check.  Dental exams once or twice a year.  Routine eye exams. Ask your health care provider how often you should have your eyes checked.  Personal lifestyle choices, including: ? Daily care of your teeth and gums. ? Regular physical activity. ? Eating a healthy diet. ? Avoiding tobacco and drug use. ? Limiting alcohol use. ? Practicing safe sex. ? Taking low-dose aspirin daily starting at age 35. ? Taking vitamin and mineral supplements as recommended by your health care provider. What happens during an annual well check? The services and screenings done by your health care provider during your annual well check  will depend on your age, overall health, lifestyle risk factors, and family history of disease. Counseling Your health care provider may ask you questions about your:  Alcohol use.  Tobacco use.  Drug use.  Emotional well-being.  Home and relationship well-being.  Sexual activity.  Eating habits.  Work and work Statistician.  Method of birth control.  Menstrual cycle.  Pregnancy history.  Screening You may have the following tests or measurements:  Height, weight, and BMI.  Blood pressure.  Lipid and cholesterol levels. These may be checked every 5 years, or more frequently if you are over 62 years old.  Skin check.  Lung cancer screening. You may have this screening every year starting at age 40 if you have a 30-pack-year history of smoking and currently smoke or have quit within the past 15 years.  Fecal occult blood test (FOBT) of the stool. You may have this test every year starting at age 12.  Flexible sigmoidoscopy or colonoscopy. You may have a sigmoidoscopy every 5 years or a colonoscopy every 10 years starting at age 30.  Hepatitis C blood test.  Hepatitis B blood test.  Sexually transmitted disease (STD) testing.  Diabetes screening. This is done by checking your blood sugar (glucose) after you have not eaten for a while (fasting). You may have this done every 1-3 years.  Mammogram. This may be done every 1-2 years. Talk to your health care provider about when you should start having regular mammograms. This may depend on whether you have a family history of breast cancer.  BRCA-related cancer screening. This may be done if you have a family history of breast, ovarian, tubal, or peritoneal cancers.  Pelvic exam and Pap test. This may be done every 3 years starting at age 9. Starting at age 106, this may be done every 5 years if you have a Pap test in  combination with an HPV test.  Bone density scan. This is done to screen for osteoporosis. You may  have this scan if you are at high risk for osteoporosis.  Discuss your test results, treatment options, and if necessary, the need for more tests with your health care provider. Vaccines Your health care provider may recommend certain vaccines, such as:  Influenza vaccine. This is recommended every year.  Tetanus, diphtheria, and acellular pertussis (Tdap, Td) vaccine. You may need a Td booster every 10 years.  Varicella vaccine. You may need this if you have not been vaccinated.  Zoster vaccine. You may need this after age 80.  Measles, mumps, and rubella (MMR) vaccine. You may need at least one dose of MMR if you were born in 1957 or later. You may also need a second dose.  Pneumococcal 13-valent conjugate (PCV13) vaccine. You may need this if you have certain conditions and were not previously vaccinated.  Pneumococcal polysaccharide (PPSV23) vaccine. You may need one or two doses if you smoke cigarettes or if you have certain conditions.  Meningococcal vaccine. You may need this if you have certain conditions.  Hepatitis A vaccine. You may need this if you have certain conditions or if you travel or work in places where you may be exposed to hepatitis A.  Hepatitis B vaccine. You may need this if you have certain conditions or if you travel or work in places where you may be exposed to hepatitis B.  Haemophilus influenzae type b (Hib) vaccine. You may need this if you have certain conditions.  Talk to your health care provider about which screenings and vaccines you need and how often you need them. This information is not intended to replace advice given to you by your health care provider. Make sure you discuss any questions you have with your health care provider. Document Released: 04/10/2015 Document Revised: 12/02/2015   Document Reviewed: 01/13/2015 Elsevier Interactive Patient Education  2017 [employer] Vitamin D Test Why am I having this test? Vitamin D is a  vitamin that is created in your body with sunlight exposure. It is also found naturally in fish and eggs. It can also be found:  As a dietary supplement in: ? Milk. ? Breakfast cereals. ? Soy milk.  As a supplemental pill.  In some multivitamins.  You need vitamin D to maintain bone health and to support your immune system. Vitamin D levels are often monitored if you have osteoporosis or other bone disorders. Your health care provider will determine if you need this test based on your medical history. What kind of sample is taken? A blood sample is required for this test. It is usually collected by inserting a needle into a vein or by sticking a finger with a small needle. How do I prepare for this test? There is no preparation required for this test. What are the reference ranges? Reference rangesare considered healthy rangesestablished after testing a large group of healthy people. Reference rangesmay vary among different people, labs, and hospitals. It is your responsibility to obtain your test results. Ask the lab or department performing the test when and how you will get your results. What do the results mean? Reference range for 1,25-dihydroxyvitamin D:  Males: 18-64 pg/mL is considered normal.  Females: 18-78 pg/mL is considered normal.  Reference range for total 25-hydroxy D:  30-80 ng/mL is considered optimal.  20-30 ng/mL indicates vitamin D insufficiency.  Less than 20 ng/mL indicates vitamin D deficiency.  Decreased levels  of vitamin D can be seen with:  Inadequate exposure to sunlight.  Inadequate dietary intake.  Osteoporosis.  Kidney disease.  Liver disease.  Rickets.  Osteomalacia.  Gastrointestinal malabsorption.  Increased levels of vitamin D can be seen with:  Excess dietary supplements.  Hyperparathyroidism.  Sarcoidosis.  Williams syndrome.  Talk with your health care provider to discuss your results, treatment options, and if  necessary, the need for more tests. Talk with your health care provider if you have any questions about your results. Talk with your health care provider to discuss your results, treatment options, and if necessary, the need for more tests. Talk with your health care provider if you have any questions about your results. This information is not intended to replace advice given to you by your health care provider. Make sure you discuss any questions you have with your health care provider. Document Released: 04/08/2004 Document Revised: 11/18/2015 Document Reviewed: 08/07/2013 Elsevier Interactive Patient Education  2018 [employer]. Panosh M.D.

## 2017-02-20 ENCOUNTER — Encounter: Payer: Self-pay | Admitting: Internal Medicine

## 2017-02-20 ENCOUNTER — Ambulatory Visit (INDEPENDENT_AMBULATORY_CARE_PROVIDER_SITE_OTHER): Payer: BLUE CROSS/BLUE SHIELD | Admitting: Internal Medicine

## 2017-02-20 VITALS — BP 108/68 | HR 72 | Temp 98.2°F | Ht 62.25 in | Wt 128.6 lb

## 2017-02-20 DIAGNOSIS — R7989 Other specified abnormal findings of blood chemistry: Secondary | ICD-10-CM

## 2017-02-20 DIAGNOSIS — H6993 Unspecified Eustachian tube disorder, bilateral: Secondary | ICD-10-CM

## 2017-02-20 DIAGNOSIS — L819 Disorder of pigmentation, unspecified: Secondary | ICD-10-CM | POA: Diagnosis not present

## 2017-02-20 DIAGNOSIS — Z0000 Encounter for general adult medical examination without abnormal findings: Secondary | ICD-10-CM | POA: Diagnosis not present

## 2017-02-20 DIAGNOSIS — Z23 Encounter for immunization: Secondary | ICD-10-CM | POA: Diagnosis not present

## 2017-02-20 DIAGNOSIS — H6983 Other specified disorders of Eustachian tube, bilateral: Secondary | ICD-10-CM | POA: Diagnosis not present

## 2017-02-20 DIAGNOSIS — H6120 Impacted cerumen, unspecified ear: Secondary | ICD-10-CM

## 2017-02-20 LAB — CBC WITH DIFFERENTIAL/PLATELET
BASOS ABS: 0 10*3/uL (ref 0.0–0.1)
Basophils Relative: 0.5 % (ref 0.0–3.0)
EOS ABS: 0.1 10*3/uL (ref 0.0–0.7)
Eosinophils Relative: 1.6 % (ref 0.0–5.0)
HEMATOCRIT: 36.8 % (ref 36.0–46.0)
HEMOGLOBIN: 12.4 g/dL (ref 12.0–15.0)
LYMPHS PCT: 49.5 % — AB (ref 12.0–46.0)
Lymphs Abs: 2.3 10*3/uL (ref 0.7–4.0)
MCHC: 33.8 g/dL (ref 30.0–36.0)
MCV: 92.1 fl (ref 78.0–100.0)
MONOS PCT: 5.8 % (ref 3.0–12.0)
Monocytes Absolute: 0.3 10*3/uL (ref 0.1–1.0)
NEUTROS ABS: 2 10*3/uL (ref 1.4–7.7)
Neutrophils Relative %: 42.6 % — ABNORMAL LOW (ref 43.0–77.0)
Platelets: 231 10*3/uL (ref 150.0–400.0)
RBC: 4 Mil/uL (ref 3.87–5.11)
RDW: 12.1 % (ref 11.5–15.5)
WBC: 4.6 10*3/uL (ref 4.0–10.5)

## 2017-02-20 LAB — LIPID PANEL
CHOL/HDL RATIO: 4
Cholesterol: 182 mg/dL (ref 0–200)
HDL: 42.9 mg/dL (ref >39.00)
LDL Cholesterol: 112 mg/dL — ABNORMAL HIGH (ref 0–99)
NONHDL: 139.38
Triglycerides: 137 mg/dL (ref 0.0–149.0)
VLDL: 27.4 mg/dL (ref 0.0–40.0)

## 2017-02-20 LAB — HEPATIC FUNCTION PANEL
ALBUMIN: 4.1 g/dL (ref 3.5–5.2)
ALK PHOS: 41 U/L (ref 39–117)
ALT: 23 U/L (ref 0–35)
AST: 17 U/L (ref 0–37)
BILIRUBIN DIRECT: 0.1 mg/dL (ref 0.0–0.3)
TOTAL PROTEIN: 6.5 g/dL (ref 6.0–8.3)
Total Bilirubin: 0.4 mg/dL (ref 0.2–1.2)

## 2017-02-20 LAB — VITAMIN D 25 HYDROXY (VIT D DEFICIENCY, FRACTURES): VITD: 16.91 ng/mL — ABNORMAL LOW (ref 30.00–100.00)

## 2017-02-20 LAB — BASIC METABOLIC PANEL
BUN: 11 mg/dL (ref 6–23)
CALCIUM: 9 mg/dL (ref 8.4–10.5)
CO2: 28 meq/L (ref 19–32)
Chloride: 104 mEq/L (ref 96–112)
Creatinine, Ser: 0.57 mg/dL (ref 0.40–1.20)
GFR: 118.87 mL/min (ref >60.00)
Glucose, Bld: 84 mg/dL (ref 70–99)
Potassium: 4.2 mEq/L (ref 3.5–5.1)
SODIUM: 138 meq/L (ref 135–145)

## 2017-02-20 LAB — TSH: TSH: 2.32 u[IU]/mL (ref 0.35–4.50)

## 2017-02-20 NOTE — Patient Instructions (Addendum)
See dermatology . About the skin pigmentation  Will notify you  of labs when available.  Vit d 800 - 1000 iu per day   In supp or foods   Can try daily nasal cortisone such as flonase  Every day for 2-3 weeks to see if helps the ear sx .   Wax in ear is normal but sometimes can cause a sx if  Stopping up the canal.      Preventive Care 40-64 Years, Female Preventive care refers to lifestyle choices and visits with your health care provider that can promote health and wellness. What does preventive care include?  A yearly physical exam. This is also called an annual well check.  Dental exams once or twice a year.  Routine eye exams. Ask your health care provider how often you should have your eyes checked.  Personal lifestyle choices, including: ? Daily care of your teeth and gums. ? Regular physical activity. ? Eating a healthy diet. ? Avoiding tobacco and drug use. ? Limiting alcohol use. ? Practicing safe sex. ? Taking low-dose aspirin daily starting at age 53. ? Taking vitamin and mineral supplements as recommended by your health care provider. What happens during an annual well check? The services and screenings done by your health care provider during your annual well check will depend on your age, overall health, lifestyle risk factors, and family history of disease. Counseling Your health care provider may ask you questions about your:  Alcohol use.  Tobacco use.  Drug use.  Emotional well-being.  Home and relationship well-being.  Sexual activity.  Eating habits.  Work and work Statistician.  Method of birth control.  Menstrual cycle.  Pregnancy history.  Screening You may have the following tests or measurements:  Height, weight, and BMI.  Blood pressure.  Lipid and cholesterol levels. These may be checked every 5 years, or more frequently if you are over 61 years old.  Skin check.  Lung cancer screening. You may have this screening every  year starting at age 51 if you have a 30-pack-year history of smoking and currently smoke or have quit within the past 15 years.  Fecal occult blood test (FOBT) of the stool. You may have this test every year starting at age 61.  Flexible sigmoidoscopy or colonoscopy. You may have a sigmoidoscopy every 5 years or a colonoscopy every 10 years starting at age 8.  Hepatitis C blood test.  Hepatitis B blood test.  Sexually transmitted disease (STD) testing.  Diabetes screening. This is done by checking your blood sugar (glucose) after you have not eaten for a while (fasting). You may have this done every 1-3 years.  Mammogram. This may be done every 1-2 years. Talk to your health care provider about when you should start having regular mammograms. This may depend on whether you have a family history of breast cancer.  BRCA-related cancer screening. This may be done if you have a family history of breast, ovarian, tubal, or peritoneal cancers.  Pelvic exam and Pap test. This may be done every 3 years starting at age 28. Starting at age 74, this may be done every 5 years if you have a Pap test in combination with an HPV test.  Bone density scan. This is done to screen for osteoporosis. You may have this scan if you are at high risk for osteoporosis.  Discuss your test results, treatment options, and if necessary, the need for more tests with your health care provider. Vaccines Your  health care provider may recommend certain vaccines, such as:  Influenza vaccine. This is recommended every year.  Tetanus, diphtheria, and acellular pertussis (Tdap, Td) vaccine. You may need a Td booster every 10 years.  Varicella vaccine. You may need this if you have not been vaccinated.  Zoster vaccine. You may need this after age 31.  Measles, mumps, and rubella (MMR) vaccine. You may need at least one dose of MMR if you were born in 1957 or later. You may also need a second dose.  Pneumococcal  13-valent conjugate (PCV13) vaccine. You may need this if you have certain conditions and were not previously vaccinated.  Pneumococcal polysaccharide (PPSV23) vaccine. You may need one or two doses if you smoke cigarettes or if you have certain conditions.  Meningococcal vaccine. You may need this if you have certain conditions.  Hepatitis A vaccine. You may need this if you have certain conditions or if you travel or work in places where you may be exposed to hepatitis A.  Hepatitis B vaccine. You may need this if you have certain conditions or if you travel or work in places where you may be exposed to hepatitis B.  Haemophilus influenzae type b (Hib) vaccine. You may need this if you have certain conditions.  Talk to your health care provider about which screenings and vaccines you need and how often you need them. This information is not intended to replace advice given to you by your health care provider. Make sure you discuss any questions you have with your health care provider. Document Released: 04/10/2015 Document Revised: 12/02/2015   Document Reviewed: 01/13/2015 Elsevier Interactive Patient Education  2017 [employer] Vitamin D Test Why am I having this test? Vitamin D is a vitamin that is created in your body with sunlight exposure. It is also found naturally in fish and eggs. It can also be found:  As a dietary supplement in: ? Milk. ? Breakfast cereals. ? Soy milk.  As a supplemental pill.  In some multivitamins.  You need vitamin D to maintain bone health and to support your immune system. Vitamin D levels are often monitored if you have osteoporosis or other bone disorders. Your health care provider will determine if you need this test based on your medical history. What kind of sample is taken? A blood sample is required for this test. It is usually collected by inserting a needle into a vein or by sticking a finger with a small needle. How do I prepare for  this test? There is no preparation required for this test. What are the reference ranges? Reference rangesare considered healthy rangesestablished after testing a large group of healthy people. Reference rangesmay vary among different people, labs, and hospitals. It is your responsibility to obtain your test results. Ask the lab or department performing the test when and how you will get your results. What do the results mean? Reference range for 1,25-dihydroxyvitamin D:  Males: 18-64 pg/mL is considered normal.  Females: 18-78 pg/mL is considered normal.  Reference range for total 25-hydroxy D:  30-80 ng/mL is considered optimal.  20-30 ng/mL indicates vitamin D insufficiency.  Less than 20 ng/mL indicates vitamin D deficiency.  Decreased levels of vitamin D can be seen with:  Inadequate exposure to sunlight.  Inadequate dietary intake.  Osteoporosis.  Kidney disease.  Liver disease.  Rickets.  Osteomalacia.  Gastrointestinal malabsorption.  Increased levels of vitamin D can be seen with:  Excess dietary supplements.  Hyperparathyroidism.  Sarcoidosis.  Jimmye Norman  syndrome.  Talk with your health care provider to discuss your results, treatment options, and if necessary, the need for more tests. Talk with your health care provider if you have any questions about your results. Talk with your health care provider to discuss your results, treatment options, and if necessary, the need for more tests. Talk with your health care provider if you have any questions about your results. This information is not intended to replace advice given to you by your health care provider. Make sure you discuss any questions you have with your health care provider. Document Released: 04/08/2004 Document Revised: 11/18/2015 Document Reviewed: 08/07/2013 Elsevier Interactive Patient Education  [employer].

## 2017-02-24 DIAGNOSIS — Z6822 Body mass index (BMI) 22.0-22.9, adult: Secondary | ICD-10-CM | POA: Diagnosis not present

## 2017-02-24 DIAGNOSIS — Z01419 Encounter for gynecological examination (general) (routine) without abnormal findings: Secondary | ICD-10-CM | POA: Diagnosis not present

## 2017-05-08 DIAGNOSIS — L819 Disorder of pigmentation, unspecified: Secondary | ICD-10-CM | POA: Diagnosis not present

## 2017-05-08 DIAGNOSIS — L811 Chloasma: Secondary | ICD-10-CM | POA: Diagnosis not present

## 2017-05-08 DIAGNOSIS — L700 Acne vulgaris: Secondary | ICD-10-CM | POA: Diagnosis not present

## 2017-07-10 DIAGNOSIS — Z1231 Encounter for screening mammogram for malignant neoplasm of breast: Secondary | ICD-10-CM | POA: Diagnosis not present

## 2017-07-10 DIAGNOSIS — L0390 Cellulitis, unspecified: Secondary | ICD-10-CM | POA: Diagnosis not present

## 2017-07-20 DIAGNOSIS — L811 Chloasma: Secondary | ICD-10-CM | POA: Diagnosis not present

## 2018-01-15 DIAGNOSIS — H2513 Age-related nuclear cataract, bilateral: Secondary | ICD-10-CM | POA: Diagnosis not present

## 2018-01-15 DIAGNOSIS — H40023 Open angle with borderline findings, high risk, bilateral: Secondary | ICD-10-CM | POA: Diagnosis not present

## 2018-01-15 DIAGNOSIS — H25013 Cortical age-related cataract, bilateral: Secondary | ICD-10-CM | POA: Diagnosis not present

## 2018-01-15 DIAGNOSIS — H04123 Dry eye syndrome of bilateral lacrimal glands: Secondary | ICD-10-CM | POA: Diagnosis not present

## 2018-02-26 DIAGNOSIS — J029 Acute pharyngitis, unspecified: Secondary | ICD-10-CM | POA: Diagnosis not present

## 2018-02-26 DIAGNOSIS — Z01419 Encounter for gynecological examination (general) (routine) without abnormal findings: Secondary | ICD-10-CM | POA: Diagnosis not present

## 2018-02-26 DIAGNOSIS — D259 Leiomyoma of uterus, unspecified: Secondary | ICD-10-CM | POA: Diagnosis not present

## 2018-02-26 DIAGNOSIS — Z1151 Encounter for screening for human papillomavirus (HPV): Secondary | ICD-10-CM | POA: Diagnosis not present

## 2018-02-26 DIAGNOSIS — Z6823 Body mass index (BMI) 23.0-23.9, adult: Secondary | ICD-10-CM | POA: Diagnosis not present

## 2018-02-26 DIAGNOSIS — N393 Stress incontinence (female) (male): Secondary | ICD-10-CM | POA: Diagnosis not present

## 2018-04-05 ENCOUNTER — Telehealth: Payer: Self-pay | Admitting: Internal Medicine

## 2018-04-05 ENCOUNTER — Ambulatory Visit: Payer: Self-pay | Admitting: *Deleted

## 2018-04-05 ENCOUNTER — Other Ambulatory Visit: Payer: Self-pay | Admitting: Adult Health

## 2018-04-05 MED ORDER — OSELTAMIVIR PHOSPHATE 75 MG PO CAPS: 75.0000 mg | ORAL_CAPSULE | Freq: Every day | ORAL | 0 refills | Status: DC

## 2018-04-05 NOTE — Telephone Encounter (Signed)
See triage note 04/05/18

## 2018-04-05 NOTE — Telephone Encounter (Signed)
Sent to pharmacy 

## 2018-04-05 NOTE — Telephone Encounter (Signed)
Pt has been caring for sick child with Flu Pt now having sx's - Burning throat, chest feels tight, feels slightly feverish, cough Requesting Tamiflu.   Please advise Dr Shelton Silvas as Dr Regis Bill is not in office this afternoon. Thanks.

## 2018-04-05 NOTE — Telephone Encounter (Signed)
Attempted to call patient to assess her symptoms and possibly make appointment - no answer at number- left message to call back.

## 2018-04-05 NOTE — Telephone Encounter (Signed)
See triage note.

## 2018-04-05 NOTE — Telephone Encounter (Signed)
Patient is calling to report her daughter tested positive for flu yesterday. 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 She is requesting Tamiflu treatment- request sent to PCP for review.  Reason for Disposition . 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  Answer Assessment - Initial Assessment Questions 1. TYPE of EXPOSURE: "How were you exposed?" (e.g., close contact, not a close contact)     Close contact child 2. DATE of EXPOSURE: "When did the exposure occur?" (e.g., hour, days, weeks)     Diagnosed yesterday in office- she started symptoms Monday night 3. PREGNANCY: "Is there any chance you are pregnant?" "When was your last menstrual period?"     Irregular cycle- not pregnant- 02/2018 4. HIGH RISK for COMPLICATIONS: "Do you have any heart or lung problems? Do you have a weakened immune system?" (e.g., CHF, COPD, asthma, HIV positive, chemotherapy, renal failure, diabetes mellitus, sickle cell anemia)     no 5. SYMPTOMS: "Do you have any symptoms?" (e.g., cough, fever, sore throat, difficulty breathing).     Burning throat, chest feels tight, feels slightly feverish, cough  Protocols used: INFLUENZA EXPOSURE-A-AH

## 2018-04-05 NOTE — Telephone Encounter (Signed)
Copied from [place] [phone]. Topic: Quick Communication - See Telephone Encounter >> Apr 05, 2018  3:17 PM Ivar Drape wrote: CRM for notification. See Telephone encounter for: 04/05/18. Patient's daughter was diagnosed with the flu yesterday 04/04/2018 and now the patient is exibiting some of the same symptoms.  She would like a prescription for Tamaflu called into her preferred pharmacy [facility] on [address].

## 2018-04-06 NOTE — Telephone Encounter (Signed)
Left detailed message on machine making aware that Rx has been sent to pharmacy. Advised call back if any questions.

## 2018-04-09 DIAGNOSIS — D259 Leiomyoma of uterus, unspecified: Secondary | ICD-10-CM | POA: Diagnosis not present

## 2018-04-09 DIAGNOSIS — N920 Excessive and frequent menstruation with regular cycle: Secondary | ICD-10-CM | POA: Diagnosis not present

## 2018-04-12 ENCOUNTER — Ambulatory Visit: Payer: Self-pay

## 2018-04-12 NOTE — Telephone Encounter (Signed)
Pt. reported she had cough, sore throat, and fever last week, following exposure to daughter with flu.  Reported has been taking Tamiflu for about 5 days; sore throat resolved.  Has "slight cough".  Denied shortness of breath, but can tell she has some chest congestion.  Denied any nasal drainage.  Denied body aches.  Last temp. was on Monday; 102 degrees.  Does not think she is running a temperature at this time.  Reported intermittent feeling of clamminess.  C/o leg cramps yesterday.  Stated she is drinking and eating okay.  Returned to work today, and reported she has a overall feeling of being very tired and weak.  Described feeling "disconnected and foggy off and on".  Denied headache, slurred speech, weakness of extremities. Home care advice given; encouraged 8 hrs. sleep / night, increase oral fluids, humidifier at bedside, Tylenol for fever/ chills.  Call back if symptoms worsen or don't improve.  Pt. Verb. Understanding.  Agrees w/ plan.               Reason for Disposition . [1] Probable influenza (fever) with no complications AND [8] NOT HIGH RISK  Answer Assessment - Initial Assessment Questions 1. WORST SYMPTOM: "What is your worst symptom?" (e.g., cough, runny nose, muscle aches, headache, sore throat, fever)      Feels weak, tired, clammy  2. ONSET: "When did your flu symptoms start?"      One week ago  3. COUGH: "How bad is the cough?"       Slight cough 4. RESPIRATORY DISTRESS: "Describe your breathing."     Denied shortness of breath 5. FEVER: "Do you have a fever?" If so, ask: "What is your temperature, how was it measured, and when did it start?"     Denied fever  6. EXPOSURE: "Were you exposed to someone with influenza?"      Daughter diag. With flu on 1/7 7. FLU VACCINE: "Did you get a flu shot this year?"    Yes; also started Tamiflu about 5 days ago 8. HIGH RISK DISEASE: "Do you any chronic medical problems?" (e.g., heart or lung disease, asthma, weak immune system, or  other HIGH RISK conditions)    Denied chronic medical problems  9. PREGNANCY: "Is there any chance you are pregnant?" "When was your last menstrual period?"     Last menstrual cycle was 02/2018; having irregular periods  10. OTHER SYMPTOMS: "Do you have any other symptoms?"  (e.g., runny nose, muscle aches, headache, sore throat)       Leg muscle cramping yesterday; taking in fluids well; just feels "disconnected or foggy off and on", and very tired and weak  Protocols used: INFLUENZA - SEASONAL-A-AH  Message from [facility] sent at 04/12/2018 1:21 PM EST   Summary: Call back    Patient states that her daughter was diagnosed with the flu last Tuesday, the day after her diagnosis she started feeling ill, had headache, cold chills, tired. She is still having symptoms and would like to discuss and decide if she needs to be seen.  Best call back is [phone]

## 2018-04-20 NOTE — Progress Notes (Addendum)
Chief Complaint  Patient presents with  . Annual Exam    Pt wants to make sure her vitamin d is still okay and obgyn wants to make sure she is not anemic due to heavy bleeding and fibroids     HPI: Patient  Katie Lawson  53 y.o. comes in today for [place] visit  See above   Health Maintenance  Topic Date Due  . HIV Screening  03/04/1981  . MAMMOGRAM  01/27/2019  . TETANUS/TDAP  04/19/2019  . PAP SMEAR-Modifier  03/12/2021  . COLONOSCOPY  04/28/2025  . INFLUENZA VACCINE  Completed   Health Maintenance Review LIFESTYLE:  Exercise:  Usually    X last months with flu .  3 x per week.  Tobacco/ETS: no Alcohol:   no Sugar beverages: no Sleep: 7-8 hours  Drug use: no HH of  2 + 2 in laws and  Daughter off   4.5  No animals  Work: about    30 +  Periods   Not monthy last a while     Has fibroids     ROS:  GEN/ HEENT: No fever, significant weight changes sweats headaches vision problems hearing changes, CV/ PULM; No chest pain shortness of breath cough, syncope,edema  change in exercise tolerance. GI /GU: No adominal pain, vomiting, change in bowel habits. No blood in the stool. No significant GU symptoms. SKIN/HEME: ,no acute skin rashes suspicious lesions or bleeding. No lymphadenopathy, nodules, masses.  NEURO/ PSYCH:  No neurologic signs such as weakness numbness. No depression anxiety. IMM/ Allergy: No unusual infections.  Allergy .   REST of 12 system review negative except as per HPI   Past Medical History:  Diagnosis Date  . Abnormal findings on diagnostic imaging of gall bladder    low EF  . Elevated IOP    watch for glaucoma  . Fibroid, uterine    seen on ct  . Normal cardiac stress test    1 2015    No past surgical history on file.  Family History  Problem Relation Age of Onset  . Hypertension Mother   . Healthy Daughter   . Healthy Daughter   . Breast cancer Unknown        GParent  . Heart attack Unknown        mgf  muncle    Social History   Socioeconomic History  . Marital status: Married    Spouse name: Not on file  . Number of children: Not on file  . Years of education: Not on file  . Highest education level: Not on file  Occupational History  . Not on file  Social Needs  . Financial resource strain: Not on file  . Food insecurity:    Worry: Not on file    Inability: Not on file  . Transportation needs:    Medical: Not on file    Non-medical: Not on file  Tobacco Use  . Smoking status: Never Smoker  . Smokeless tobacco: Never Used  Substance and Sexual Activity  . Alcohol use: No  . Drug use: Not on file  . Sexual activity: Not on file  Lifestyle  . Physical activity:    Days per week: Not on file    Minutes per session: Not on file  . Stress: Not on file  Relationships  . Social connections:    Talks on phone: Not on file    Gets together: Not on file    Attends  religious service: Not on file    Active member of club or organization: Not on file    Attends meetings of clubs or organizations: Not on file    Relationship status: Not on file  Other Topics Concern  . Not on file  Social History Narrative   H H  of  3-4 daughter college   Married  Masters education  homemaker   Husband works for [employer]  Smoke alarm seat belts no ets.    Moved from [place] P2   No pets.   Exercise  At least 3 x per week.       Caffiene; diet coke .     Outpatient Medications Prior to Visit  Medication Sig Dispense Refill  . Vitamin D, Ergocalciferol, (DRISDOL) 50000 units CAPS capsule     . clindamycin-benzoyl peroxide (BENZACLIN) gel Apply to affected area 2 times daily for adult acne 25 g 3  . oseltamivir (TAMIFLU) 75 MG capsule Take 1 capsule (75 mg total) by mouth daily. (Patient not taking: Reported on 04/23/2018) 7 capsule 0   No facility-administered medications prior to visit.      EXAM:  BP 118/76 (BP Location: Left Arm, Patient Position: Sitting, Cuff  Size: Normal)   Pulse 82   Temp 98.3 F (36.8 C) (Oral)   Wt 126 lb 12.8 oz (57.5 kg)   LMP 02/27/2018 (Within Weeks)   BMI 23.01 kg/m   Body mass index is 23.01 kg/m. Wt Readings from Last 3 Encounters:  04/23/18 126 lb 12.8 oz (57.5 kg)  02/20/17 128 lb 9.6 oz (58.3 kg)  07/04/16 127 lb 12.8 oz (58 kg)    Physical Exam: Vital signs reviewed WPY:KDXI is a well-developed well-nourished alert cooperative    who appearsr stated age in no acute distress.  HEENT: normocephalic atraumatic , Eyes: PERRL EOM's full, conjunctiva clear, Nares: paten,t no deformity discharge or tenderness., Ears: no deformity EAC's clear TMs with normal landmarks. Mouth: clear OP, no lesions, edema.  Moist mucous membranes. Dentition in adequate repair. NECK: supple without masses, thyromegaly or bruits. CHEST/PULM:  Clear to auscultation and percussion breath sounds equal no wheeze , rales or rhonchi. No chest wall deformities or tenderness. Breast: declined done per gyne. CV: PMI is nondisplaced, S1 S2 no gallops, murmurs, rubs. Peripheral pulses are full without delay.No JVD .  ABDOMEN: Bowel sounds normal nontender  No guard or rebound, no hepato splenomegal no CVA tenderness.  No hernia. Extremtities:  No clubbing cyanosis or edema, no acute joint swelling or redness no focal atrophy NEURO:  Oriented x3, cranial nerves 3-12 appear to be intact, no obvious focal weakness,gait within normal limits no abnormal reflexes or asymmetrical SKIN: No acute rashes normal turgor, color, no bruising or petechiae. 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 LN: no cervical axillary inguinal adenopathy  Lab Results  Component Value Date   WBC 6.4 04/23/2018   HGB 12.3 04/23/2018   HCT 36.2 04/23/2018   PLT 267.0 04/23/2018   GLUCOSE 74 04/23/2018   CHOL 209 (H) 04/23/2018   TRIG 102.0 04/23/2018   HDL 57.30 04/23/2018    LDLCALC 131 (H) 04/23/2018   ALT 16 04/23/2018   AST 17 04/23/2018   NA 137 04/23/2018   K 3.9 04/23/2018   CL 97 04/23/2018   CREATININE 0.64 04/23/2018   BUN 12 04/23/2018   CO2 31 04/23/2018  TSH 2.61 04/23/2018    BP Readings from Last 3 Encounters:  04/23/18 118/76  02/20/17 108/68  07/04/16 104/82    Lab plan   ASSESSMENT AND PLAN:  Discussed the following assessment and plan:  Visit for preventive health examination - Plan: Basic metabolic panel, CBC with Differential/Platelet, Hepatic function panel, Lipid panel, TSH, VITAMIN D 25 Hydroxy (Vit-D Deficiency, Fractures)  Low vitamin D level - Plan: Basic metabolic panel, CBC with Differential/Platelet, Hepatic function panel, Lipid panel, TSH, VITAMIN D 25 Hydroxy (Vit-D Deficiency, Fractures)  Mild anemia - Plan: Basic metabolic panel, CBC with Differential/Platelet, Hepatic function panel, Lipid panel, TSH, VITAMIN D 25 Hydroxy (Vit-D Deficiency, Fractures)  Screening for HIV (human immunodeficiency virus) - Plan: HIV Antibody (routine testing w rflx) Healthy today    Expectant management. And labs  Yearly  Exam and assessment   ( height wasn't recorded at this  Visit  Noted after patient left ) No change in lipoma Patient Care Team: , Standley Brooking, MD as PCP - General (Internal Medicine) Azucena Fallen, MD as Attending Physician (Obstetrics and Gynecology) Juanita Craver, MD as Attending Physician (Gastroenterology) Adrian Prows, MD as Consulting Physician (Cardiology) Patient Instructions  Will notify you  of labs when available.    Preventive Care 40-64 Years, Female Preventive care refers to lifestyle choices and visits with your health care provider that can promote health and wellness. What does preventive care include?   A yearly physical exam. This is also called an annual well check.  Dental exams once or twice a year.  Routine eye exams. Ask your health care provider how often you should have your  eyes checked.  Personal lifestyle choices, including: ? Daily care of your teeth and gums. ? Regular physical activity. ? Eating a healthy diet. ? Avoiding tobacco and drug use. ? Limiting alcohol use. ? Practicing safe sex. ? Taking low-dose aspirin daily starting at age 64. ? Taking vitamin and mineral supplements as recommended by your health care provider. What happens during an annual well check? The services and screenings done by your health care provider during your annual well check will depend on your age, overall health, lifestyle risk factors, and family history of disease. Counseling Your health care provider may ask you questions about your:  Alcohol use.  Tobacco use.  Drug use.  Emotional well-being.  Home and relationship well-being.  Sexual activity.  Eating habits.  Work and work Statistician.  Method of birth control.  Menstrual cycle.  Pregnancy history. Screening You may have the following tests or measurements:  Height, weight, and BMI.  Blood pressure.  Lipid and cholesterol levels. These may be checked every 5 years, or more frequently if you are over 90 years old.  Skin check.  Lung cancer screening. You may have this screening every year starting at age 59 if you have a 30-pack-year history of smoking and currently smoke or have quit within the past 15 years.  Colorectal cancer screening. All adults should have this screening starting at age 64 and continuing until age 59. Your health care provider may recommend screening at age 28. You will have tests every 1-10 years, depending on your results and the type of screening test. People at increased risk should start screening at an earlier age. Screening tests may include: ? Guaiac-based fecal occult blood testing. ? Fecal immunochemical test (FIT). ? Stool DNA test. ? Virtual colonoscopy. ? Sigmoidoscopy. During this test, a flexible tube with a tiny camera (sigmoidoscope) is used to  examine your rectum and lower colon. The sigmoidoscope is inserted through your anus into your rectum and lower colon. ? Colonoscopy. During this test, a long, thin, flexible tube with a tiny camera (colonoscope) is used to examine your entire colon and rectum.  Hepatitis C blood test.  Hepatitis B blood test.  Sexually transmitted disease (STD) testing.  Diabetes screening. This is done by checking your blood sugar (glucose) after you have not eaten for a while (fasting). You may have this done every 1-3 years.  Mammogram. This may be done every 1-2 years. Talk to your health care provider about when you should start having regular mammograms. This may depend on whether you have a family history of breast cancer.  BRCA-related cancer screening. This may be done if you have a family history of breast, ovarian, tubal, or peritoneal cancers.  Pelvic exam and Pap test. This may be done every 3 years starting at age 54. Starting at age 53, this may be done every 5 years if you have a Pap test in combination with an HPV test.  Bone density scan. This is done to screen for osteoporosis. You may have this scan if you are at high risk for osteoporosis. Discuss your test results, treatment options, and if necessary, the need for more tests with your health care provider. Vaccines Your health care provider may recommend certain vaccines, such as:  Influenza vaccine. This is recommended every year.  Tetanus, diphtheria, and acellular pertussis (Tdap, Td) vaccine. You may need a Td booster every 10 years.  Varicella vaccine. You may need this if you have not been vaccinated.  Zoster vaccine. You may need this after age 52.  Measles, mumps, and rubella (MMR) vaccine. You may need at least one dose of MMR if you were born in 1957 or later. You may also need a second dose.  Pneumococcal 13-valent conjugate (PCV13) vaccine. You may need this if you have certain conditions and were not previously  vaccinated.  Pneumococcal polysaccharide (PPSV23) vaccine. You may need one or two doses if you smoke cigarettes or if you have certain conditions.  Meningococcal vaccine. You may need this if you have certain conditions.  Hepatitis A vaccine. You may need this if you have certain conditions or if you travel or work in places where you may be exposed to hepatitis A.  Hepatitis B vaccine. You may need this if you have certain conditions or if you travel or work in places where you may be exposed to hepatitis B.  Haemophilus influenzae type b (Hib) vaccine. You may need this if you have certain conditions. Talk to your health care provider about which screenings and vaccines you need and how often you need them. This information is not intended to replace advice given to you by your health care provider. Make sure you discuss any questions you have with your health care provider. Document Released: 04/10/2015 Document Revised: 05/04/2017 Document Reviewed: 01/13/2015 Elsevier Interactive Patient Education  2019 [place] K.  M.D.

## 2018-04-23 ENCOUNTER — Encounter: Payer: Self-pay | Admitting: Internal Medicine

## 2018-04-23 ENCOUNTER — Ambulatory Visit (INDEPENDENT_AMBULATORY_CARE_PROVIDER_SITE_OTHER): Payer: BLUE CROSS/BLUE SHIELD | Admitting: Internal Medicine

## 2018-04-23 VITALS — BP 118/76 | HR 82 | Temp 98.3°F | Wt 126.8 lb

## 2018-04-23 DIAGNOSIS — Z0000 Encounter for general adult medical examination without abnormal findings: Secondary | ICD-10-CM

## 2018-04-23 DIAGNOSIS — R7989 Other specified abnormal findings of blood chemistry: Secondary | ICD-10-CM

## 2018-04-23 DIAGNOSIS — Z114 Encounter for screening for human immunodeficiency virus [HIV]: Secondary | ICD-10-CM | POA: Diagnosis not present

## 2018-04-23 DIAGNOSIS — D649 Anemia, unspecified: Secondary | ICD-10-CM

## 2018-04-23 LAB — TSH: TSH: 2.61 u[IU]/mL (ref 0.35–4.50)

## 2018-04-23 LAB — LIPID PANEL
CHOLESTEROL: 209 mg/dL — AB (ref 0–200)
HDL: 57.3 mg/dL (ref >39.00)
LDL Cholesterol: 131 mg/dL — ABNORMAL HIGH (ref 0–99)
NonHDL: 151.71
TRIGLYCERIDES: 102 mg/dL (ref 0.0–149.0)
Total CHOL/HDL Ratio: 4
VLDL: 20.4 mg/dL (ref 0.0–40.0)

## 2018-04-23 LAB — CBC WITH DIFFERENTIAL/PLATELET
BASOS PCT: 0.3 % (ref 0.0–3.0)
Basophils Absolute: 0 10*3/uL (ref 0.0–0.1)
EOS ABS: 0.1 10*3/uL (ref 0.0–0.7)
EOS PCT: 1.4 % (ref 0.0–5.0)
HEMATOCRIT: 36.2 % (ref 36.0–46.0)
HEMOGLOBIN: 12.3 g/dL (ref 12.0–15.0)
LYMPHS PCT: 51.6 % — AB (ref 12.0–46.0)
Lymphs Abs: 3.3 10*3/uL (ref 0.7–4.0)
MCHC: 33.9 g/dL (ref 30.0–36.0)
MCV: 88.7 fl (ref 78.0–100.0)
MONOS PCT: 6.5 % (ref 3.0–12.0)
Monocytes Absolute: 0.4 10*3/uL (ref 0.1–1.0)
NEUTROS ABS: 2.6 10*3/uL (ref 1.4–7.7)
Neutrophils Relative %: 40.2 % — ABNORMAL LOW (ref 43.0–77.0)
PLATELETS: 267 10*3/uL (ref 150.0–400.0)
RBC: 4.09 Mil/uL (ref 3.87–5.11)
RDW: 12.1 % (ref 11.5–15.5)
WBC: 6.4 10*3/uL (ref 4.0–10.5)

## 2018-04-23 LAB — BASIC METABOLIC PANEL
BUN: 12 mg/dL (ref 6–23)
CHLORIDE: 97 meq/L (ref 96–112)
CO2: 31 meq/L (ref 19–32)
CREATININE: 0.64 mg/dL (ref 0.40–1.20)
Calcium: 10.1 mg/dL (ref 8.4–10.5)
GFR: 97.39 mL/min (ref >60.00)
Glucose, Bld: 74 mg/dL (ref 70–99)
POTASSIUM: 3.9 meq/L (ref 3.5–5.1)
Sodium: 137 mEq/L (ref 135–145)

## 2018-04-23 LAB — HEPATIC FUNCTION PANEL
ALBUMIN: 4.7 g/dL (ref 3.5–5.2)
ALT: 16 U/L (ref 0–35)
AST: 17 U/L (ref 0–37)
Alkaline Phosphatase: 56 U/L (ref 39–117)
Bilirubin, Direct: 0.1 mg/dL (ref 0.0–0.3)
Total Bilirubin: 0.7 mg/dL (ref 0.2–1.2)
Total Protein: 7.3 g/dL (ref 6.0–8.3)

## 2018-04-23 LAB — VITAMIN D 25 HYDROXY (VIT D DEFICIENCY, FRACTURES): VITD: 27.19 ng/mL — AB (ref 30.00–100.00)

## 2018-04-23 NOTE — Patient Instructions (Signed)
Will notify you  of labs when available.    Preventive Care 40-64 Years, Female Preventive care refers to lifestyle choices and visits with your health care provider that can promote health and wellness. What does preventive care include?   A yearly physical exam. This is also called an annual well check.  Dental exams once or twice a year.  Routine eye exams. Ask your health care provider how often you should have your eyes checked.  Personal lifestyle choices, including: ? Daily care of your teeth and gums. ? Regular physical activity. ? Eating a healthy diet. ? Avoiding tobacco and drug use. ? Limiting alcohol use. ? Practicing safe sex. ? Taking low-dose aspirin daily starting at age 72. ? Taking vitamin and mineral supplements as recommended by your health care provider. What happens during an annual well check? The services and screenings done by your health care provider during your annual well check will depend on your age, overall health, lifestyle risk factors, and family history of disease. Counseling Your health care provider may ask you questions about your:  Alcohol use.  Tobacco use.  Drug use.  Emotional well-being.  Home and relationship well-being.  Sexual activity.  Eating habits.  Work and work Statistician.  Method of birth control.  Menstrual cycle.  Pregnancy history. Screening You may have the following tests or measurements:  Height, weight, and BMI.  Blood pressure.  Lipid and cholesterol levels. These may be checked every 5 years, or more frequently if you are over 10 years old.  Skin check.  Lung cancer screening. You may have this screening every year starting at age 58 if you have a 30-pack-year history of smoking and currently smoke or have quit within the past 15 years.  Colorectal cancer screening. All adults should have this screening starting at age 69 and continuing until age 82. Your health care provider may recommend  screening at age 73. You will have tests every 1-10 years, depending on your results and the type of screening test. People at increased risk should start screening at an earlier age. Screening tests may include: ? Guaiac-based fecal occult blood testing. ? Fecal immunochemical test (FIT). ? Stool DNA test. ? Virtual colonoscopy. ? Sigmoidoscopy. During this test, a flexible tube with a tiny camera (sigmoidoscope) is used to examine your rectum and lower colon. The sigmoidoscope is inserted through your anus into your rectum and lower colon. ? Colonoscopy. During this test, a long, thin, flexible tube with a tiny camera (colonoscope) is used to examine your entire colon and rectum.  Hepatitis C blood test.  Hepatitis B blood test.  Sexually transmitted disease (STD) testing.  Diabetes screening. This is done by checking your blood sugar (glucose) after you have not eaten for a while (fasting). You may have this done every 1-3 years.  Mammogram. This may be done every 1-2 years. Talk to your health care provider about when you should start having regular mammograms. This may depend on whether you have a family history of breast cancer.  BRCA-related cancer screening. This may be done if you have a family history of breast, ovarian, tubal, or peritoneal cancers.  Pelvic exam and Pap test. This may be done every 3 years starting at age 40. Starting at age 51, this may be done every 5 years if you have a Pap test in combination with an HPV test.  Bone density scan. This is done to screen for osteoporosis. You may have this scan if you are  at high risk for osteoporosis. Discuss your test results, treatment options, and if necessary, the need for more tests with your health care provider. Vaccines Your health care provider may recommend certain vaccines, such as:  Influenza vaccine. This is recommended every year.  Tetanus, diphtheria, and acellular pertussis (Tdap, Td) vaccine. You may need a  Td booster every 10 years.  Varicella vaccine. You may need this if you have not been vaccinated.  Zoster vaccine. You may need this after age 72.  Measles, mumps, and rubella (MMR) vaccine. You may need at least one dose of MMR if you were born in 1957 or later. You may also need a second dose.  Pneumococcal 13-valent conjugate (PCV13) vaccine. You may need this if you have certain conditions and were not previously vaccinated.  Pneumococcal polysaccharide (PPSV23) vaccine. You may need one or two doses if you smoke cigarettes or if you have certain conditions.  Meningococcal vaccine. You may need this if you have certain conditions.  Hepatitis A vaccine. You may need this if you have certain conditions or if you travel or work in places where you may be exposed to hepatitis A.  Hepatitis B vaccine. You may need this if you have certain conditions or if you travel or work in places where you may be exposed to hepatitis B.  Haemophilus influenzae type b (Hib) vaccine. You may need this if you have certain conditions. Talk to your health care provider about which screenings and vaccines you need and how often you need them. This information is not intended to replace advice given to you by your health care provider. Make sure you discuss any questions you have with your health care provider. Document Released: 04/10/2015 Document Revised: 05/04/2017 Document Reviewed: 01/13/2015 Elsevier Interactive Patient Education  2019 [employer].

## 2018-04-24 LAB — HIV ANTIBODY (ROUTINE TESTING W REFLEX): HIV 1&2 Ab, 4th Generation: NONREACTIVE

## 2018-08-14 ENCOUNTER — Other Ambulatory Visit: Payer: Self-pay

## 2018-08-14 ENCOUNTER — Ambulatory Visit (INDEPENDENT_AMBULATORY_CARE_PROVIDER_SITE_OTHER): Payer: BLUE CROSS/BLUE SHIELD | Admitting: Internal Medicine

## 2018-08-14 ENCOUNTER — Encounter: Payer: Self-pay | Admitting: Internal Medicine

## 2018-08-14 ENCOUNTER — Ambulatory Visit: Payer: Self-pay | Admitting: *Deleted

## 2018-08-14 DIAGNOSIS — L298 Other pruritus: Secondary | ICD-10-CM | POA: Diagnosis not present

## 2018-08-14 MED ORDER — TRIAMCINOLONE ACETONIDE 0.1 % EX CREA: 1.0000 "application " | TOPICAL_CREAM | Freq: Two times a day (BID) | CUTANEOUS | 0 refills | Status: DC

## 2018-08-14 NOTE — Telephone Encounter (Signed)
Virtual appointment made 

## 2018-08-14 NOTE — Telephone Encounter (Signed)
Message from Jodie Echevaria sent at 08/14/2018 8:20 AM EDT   Patient would like a call back concerning an itchy rash on her neck that seem to be getting worst. Have been taking Zyrtec but it is not improving. Please call Ph# [phone]   Pt calling with a rash on her neck that looks like dry patches, and blotchy looking. Sometimes it is raised, looks swollen.  Looks flatter today.  But the rash is itchy and burning. Denies pain or fever.  Has used CeraVe when it itches. She has also taken Xyzal (last night). She noticed the rash about 2 weeks ago. Does not seem to be improving.  Home care advice given for itching. Try to use hydrocortisone cream and using a cool compress to the area. She is requesting an appointment. Pt advised of having a virtual one, voiced understanding. Notified LB at [facility] for an appointment. Call conference in with patient. Routing to LB at [facility]. Reason for Disposition . Localized rash present > 7 days  Answer Assessment - Initial Assessment Questions 1. APPEARANCE of RASH: "Describe the rash."      Red, blotchy, raised and swollen at times and now flat 2. LOCATION: "Where is the rash located?"      Front of the neck 3. NUMBER: "How many spots are there?"      none 4. SIZE: "How big are the spots?" (Inches, centimeters or compare to size of a coin)      n/a 5. ONSET: "When did the rash start?"      A few weeks 6. ITCHING: "Does the rash itch?" If so, ask: "How bad is the itch?"  (Scale 1-10; or mild, moderate, severe)     Yes, moderate to severe 7. PAIN: "Does the rash hurt?" If so, ask: "How bad is the pain?"  (Scale 1-10; or mild, moderate, severe)     No pain 8. OTHER SYMPTOMS: "Do you have any other symptoms?" (e.g., fever)     no 9. PREGNANCY: "Is there any chance you are pregnant?" "When was your last menstrual period?"     No period in the last few months  Protocols used: RASH OR REDNESS - LOCALIZED-A-AH

## 2018-08-14 NOTE — Progress Notes (Signed)
Virtual Visit via Video Note  I connected with@ on 08/14/18 at  9:30 AM EDT by a video enabled telemedicine application and verified that I am speaking with the correct person using two identifiers. Location patient: home Location provider:work  office Persons participating in the virtual visit: patient, provider  [place]th national recommendations  regarding COVID 19 pandemic   video visit is advised over in office visit for this patient.  Patient aware  of the limitations of evaluation and management by telemedicine and  availability of in person appointments. and agreed to proceed.   HPI: Katie Lawson presents for video visit  sda for persistent rash  On neck  See nurse triage note    Using cerave  And  Oral xyzal   Reports waxing and wanint  Dry patches  And blotching  Itchy    Topicals  Just cerave  Just starting  hcs as advised by nurse triage   No   Neck wear  jewlry new contacts  Sun exposures   And no hx of same and no other with same    ROS: See pertinent positives and negatives per HPI.  Past Medical History:  Diagnosis Date  . Abnormal findings on diagnostic imaging of gall bladder    low EF  . Elevated IOP    watch for glaucoma  . Fibroid, uterine    seen on ct  . Normal cardiac stress test    1 2015    No past surgical history on file.  Family History  Problem Relation Age of Onset  . Hypertension Mother   . Healthy Daughter   . Healthy Daughter   . Breast cancer Unknown        GParent  . Heart attack Unknown        mgf [employer]    Social History   Tobacco Use  . Smoking status: Never Smoker  . Smokeless tobacco: Never Used  Substance Use Topics  . Alcohol use: No  . Drug use: Not on file      Current Outpatient Medications:  Marland Kitchen  Vitamin D, Ergocalciferol, (DRISDOL) 50000 units CAPS capsule, , Disp: , Rfl:  .  clindamycin-benzoyl peroxide (BENZACLIN) gel, Apply to affected area 2 times daily for adult acne, Disp: 25 g, Rfl: 3 .   triamcinolone cream (KENALOG) 0.1 %, Apply 1 application topically 2 (two) times daily. As directed to rash, Disp: 30 g, Rfl: 0  EXAM: BP Readings from Last 3 Encounters:  04/23/18 118/76  02/20/17 108/68  07/04/16 104/82    VITALS per patient if applicable:  GENERAL: alert, oriented, appears well and in no acute distress  HEENT: atraumatic, conjunttiva clear, no obvious abnormalities on inspection of external nose and ears  NECK: normal movements of the head and neck  faint redness  But no obv hives some redness at neck crease area is  Very upper chest anterior neck   No obv tinea lesions  Cannot see  Well enough for other  deliniation    LUNGS: on inspection no signs of respiratory distress, breathing rate appears normal, no obvious gross SOB, gasping or wheezing  CV: no obvious cyanosis  MS: moves all visible extremities without noticeable abnormality  PSYCH/NEURO: pleasant and cooperative, no obvious depression or anxiety, speech and thought processing grossly intact   ASSESSMENT AND PLAN:  Discussed the following assessment and plan:  Pruritic erythematous rash - localized anterior neck upper chest suspect contact derm vs solar but no exposure  unknown cause  no systemic sx  Dos not appear to be infectious or  Systemic related   prob some form of CD  Counseled.  Ok to take xyxal. Cool compresses add   Steroid topical for up to 2 weeks and fu if  persistent or progressive    No obv contactants  Except location  May be typical    Expectant management and discussion of plan and treatment with opportunity to ask questions and all were answered. The patient agreed with the plan and demonstrated an understanding of the instructions.   Advised to call back or seek an in-person evaluation if worsening  or having  further concerns .   Shanon Ace, MD

## 2018-11-07 ENCOUNTER — Telehealth (INDEPENDENT_AMBULATORY_CARE_PROVIDER_SITE_OTHER): Payer: BC Managed Care – PPO | Admitting: Internal Medicine

## 2018-11-07 ENCOUNTER — Encounter: Payer: Self-pay | Admitting: Internal Medicine

## 2018-11-07 ENCOUNTER — Other Ambulatory Visit: Payer: Self-pay

## 2018-11-07 DIAGNOSIS — R5381 Other malaise: Secondary | ICD-10-CM | POA: Diagnosis not present

## 2018-11-07 DIAGNOSIS — R509 Fever, unspecified: Secondary | ICD-10-CM

## 2018-11-07 NOTE — Progress Notes (Signed)
Virtual Visit via Video Note  I connected with@ on 11/07/18 at  4:00 PM EDT by a video enabled telemedicine application and verified that I am speaking with the correct person using two identifiers. Location patient: home Location provider:work  office Persons participating in the virtual visit: patient, provider  WIth national recommendations  regarding COVID 19 pandemic   video visit is advised over in office visit for this patient.  Patient aware  of the limitations of evaluation and management by telemedicine and  availability of in person appointments. and agreed to proceed.   HPI: Katie Lawson presents for video visit Onset low grade temp 994 8/9  And ongoing but  Minor cough and ha( has migraines  But had recent trips to [place] daughter  to go to nursing school at [facility]  and then [place] area      And family in [place] area not specific  Contact but a number of people .  2 trips in the last month .  Fatherin law  on cancer rx and husband with dm are not sick with this  She is working from home and  Separating at this point . Asks about covid testing   ROS: See pertinent positives and negatives per HPI. No sob    Past Medical History:  Diagnosis Date  . Abnormal findings on diagnostic imaging of gall bladder    low EF  . Elevated IOP    watch for glaucoma  . Fibroid, uterine    seen on ct  . Normal cardiac stress test    1 2015    No past surgical history on file.  Family History  Problem Relation Age of Onset  . Hypertension Mother   . Healthy Daughter   . Healthy Daughter   . Breast cancer Unknown        GParent  . Heart attack Unknown        mgf muncle    Social History   Tobacco Use  . Smoking status: Never Smoker  . Smokeless tobacco: Never Used  Substance Use Topics  . Alcohol use: No  . Drug use: Not on file      Current Outpatient Medications:  .  clindamycin-benzoyl peroxide (BENZACLIN) gel, Apply to affected area 2 times daily for adult  acne, Disp: 25 g, Rfl: 3 .  triamcinolone cream (KENALOG) 0.1 %, Apply 1 application topically 2 (two) times daily. As directed to rash, Disp: 30 g, Rfl: 0 .  Vitamin D, Ergocalciferol, (DRISDOL) 50000 units CAPS capsule, , Disp: , Rfl:   EXAM: BP Readings from Last 3 Encounters:  04/23/18 118/76  02/20/17 108/68  07/04/16 104/82    VITALS per patient if applicable: non toxic   GENERAL: alert, oriented, appears well and in no acute distress HEENT: atraumatic, conjunttiva clear, no obvious abnormalities on inspection of external nose and ears NECK: normal movements of the head and neck LUNGS: on inspection no signs of respiratory distress, breathing rate appears normal, no obvious gross SOB, gasping or wheezing CV: no obvious cyanosis  PSYCH/NEURO: pleasant and cooperative, no obvious depression or anxiety, speech and thought processing grossly intact Lab Results  Component Value Date   WBC 6.4 04/23/2018   HGB 12.3 04/23/2018   HCT 36.2 04/23/2018   PLT 267.0 04/23/2018   GLUCOSE 74 04/23/2018   CHOL 209 (H) 04/23/2018   TRIG 102.0 04/23/2018   HDL 57.30 04/23/2018   LDLCALC 131 (H) 04/23/2018   ALT 16 04/23/2018   AST  17 04/23/2018   NA 137 04/23/2018   K 3.9 04/23/2018   CL 97 04/23/2018   CREATININE 0.64 04/23/2018   BUN 12 04/23/2018   CO2 31 04/23/2018   TSH 2.61 04/23/2018    ASSESSMENT AND PLAN:  Discussed the following assessment and plan:   ICD-10-CM   1. Low grade fever  R50.9 Novel Coronavirus, NAA ([facility])  2. Malaise  R53.81 Novel Coronavirus, NAA ([facility])    Recent travel [place] and [place]   Order covid testing  Info given  And isolate from husband at this time  And dis tat  Counseled.   Expectant management and discussion of plan and treatment with opportunity to ask questions and all were answered. The patient agreed with the plan and demonstrated an understanding of the instructions.   Advised to call back or seek an in-person evaluation if  worsening  or having  further concerns . In interim      Shanon Ace, MD

## 2018-11-08 ENCOUNTER — Other Ambulatory Visit: Payer: Self-pay

## 2018-11-08 DIAGNOSIS — Z20822 Contact with and (suspected) exposure to covid-19: Secondary | ICD-10-CM

## 2018-11-10 LAB — NOVEL CORONAVIRUS, NAA: SARS-CoV-2, NAA: NOT DETECTED

## 2019-01-25 ENCOUNTER — Other Ambulatory Visit: Payer: Self-pay

## 2019-01-25 ENCOUNTER — Telehealth: Payer: Self-pay | Admitting: Internal Medicine

## 2019-01-25 ENCOUNTER — Ambulatory Visit (INDEPENDENT_AMBULATORY_CARE_PROVIDER_SITE_OTHER): Payer: BC Managed Care – PPO

## 2019-01-25 DIAGNOSIS — Z23 Encounter for immunization: Secondary | ICD-10-CM

## 2019-01-25 DIAGNOSIS — H6120 Impacted cerumen, unspecified ear: Secondary | ICD-10-CM

## 2019-01-25 NOTE — Telephone Encounter (Signed)
Left detailed message stating that referral has been placed

## 2019-01-25 NOTE — Telephone Encounter (Signed)
Referral has been placed. 

## 2019-01-25 NOTE — Telephone Encounter (Signed)
Mainly the right ear blocked for the several weeks, patient has had ears cleared and symptoms have not improved, patient requesting a referral to ENT, please advise

## 2019-01-25 NOTE — Telephone Encounter (Signed)
Ok   To refer

## 2019-02-18 ENCOUNTER — Other Ambulatory Visit: Payer: Self-pay

## 2019-02-18 ENCOUNTER — Ambulatory Visit (INDEPENDENT_AMBULATORY_CARE_PROVIDER_SITE_OTHER): Payer: BC Managed Care – PPO | Admitting: Otolaryngology

## 2019-02-18 ENCOUNTER — Encounter (INDEPENDENT_AMBULATORY_CARE_PROVIDER_SITE_OTHER): Payer: Self-pay | Admitting: Otolaryngology

## 2019-02-18 VITALS — Temp 97.3°F

## 2019-02-18 DIAGNOSIS — J309 Allergic rhinitis, unspecified: Secondary | ICD-10-CM

## 2019-02-18 DIAGNOSIS — H6123 Impacted cerumen, bilateral: Secondary | ICD-10-CM | POA: Diagnosis not present

## 2019-02-18 MED ORDER — FLUTICASONE PROPIONATE 50 MCG/ACT NA SUSP: 2.0000 | Freq: Every day | NASAL | 6 refills | Status: DC

## 2019-02-18 NOTE — Progress Notes (Signed)
HPI: Katie Lawson is a 53 y.o. female who presents for evaluation of a blocked right ear. 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 She states she has had it cleaned multiple times. She has tried Psudafed and saline rinses without benefit. She is able to pop her ears which helps symptoms. Patient also complains of some post-nasal drip. She denies ear pain, ear drainage, sore throat. No further complaints today.  Past Medical History:  Diagnosis Date  . Abnormal findings on diagnostic imaging of gall bladder    low EF  . Elevated IOP    watch for glaucoma  . Fibroid, uterine    seen on ct  . Normal cardiac stress test    1 2015   No past surgical history on file. Social History   Socioeconomic History  . Marital status: Married    Spouse name: Not on file  . Number of children: Not on file  . Years of education: Not on file  . Highest education level: Not on file  Occupational History  . Not on file  Social Needs  . Financial resource strain: Not on file  . Food insecurity    Worry: Not on file    Inability: Not on file  . Transportation needs    Medical: Not on file    Non-medical: Not on file  Tobacco Use  . Smoking status: Never Smoker  . Smokeless tobacco: Never Used  Substance and Sexual Activity  . Alcohol use: No  . Drug use: Not on file  . Sexual activity: Not on file  Lifestyle  . Physical activity    Days per week: Not on file    Minutes per session: Not on file  . Stress: Not on file  Relationships  . Social Herbalist on phone: Not on file    Gets together: Not on file    Attends religious service: Not on file    Active member of club or organization: Not on file    Attends meetings of clubs or organizations: Not on file    Relationship status: Not on file  Other Topics Concern   . Not on file  Social History Narrative   H H  of  3-4 daughter college   Married  Masters education  homemaker   Husband works for [employer]  Smoke alarm seat belts no ets.    Moved from [place]   No pets.   Exercise  At least 3 x per week.       Caffiene; diet coke .    Family History  Problem Relation Age of Onset  . Hypertension Mother   . Healthy Daughter   . Healthy Daughter   . Breast cancer Unknown        GParent  . Heart attack Unknown        [facility] [employer]   No Known Allergies Prior to Admission medications   Medication Sig Start Date End Date Taking? Authorizing Provider  clindamycin-benzoyl peroxide (BENZACLIN) gel Apply to affected area 2 times daily for adult acne 04/18/12 04/18/13  Panosh, Standley Brooking, MD  fluticasone (FLONASE) 50 MCG/ACT nasal spray Place 2 sprays into both nostrils at bedtime. 02/18/19   Rozetta Nunnery, MD  triamcinolone cream (KENALOG) 0.1 %  Apply 1 application topically 2 (two) times daily. As directed to rash 08/14/18   Panosh, Standley Brooking, MD  Vitamin D, Ergocalciferol, (DRISDOL) 50000 units CAPS capsule  05/11/16   [provider]     Positive ROS: otherwise negative  All other systems have been reviewed and were otherwise negative with the exception of those mentioned in the HPI and as above.  Physical Exam: Constitutional: Alert, well-appearing, no acute distress Ears: External ears without lesions or tenderness. Ear canals are full of wax bilaterally which was cleaned revealing clear EACs with intact, clear TMs. Tuning forks reveal subjectively normal symmetric hearing bilaterally with AC>BC. Nasal: External nose without lesions. Septum with minimal deformity. Mild mucosal edema. Otherwise clear nasal passages Oral: Lips and gums without lesions. Tongue and palate mucosa without lesions. Posterior oropharynx with post-nasal drip but otherwise clear. Neck: No palpable adenopathy or masses Respiratory: Breathing  comfortably  Skin: No facial/neck lesions or rash noted.  Cerumen impaction removal  Date/Time: 02/18/2019 3:14 PM Performed by: Glean Hess, Christin M, PA-C Authorized by: Rozetta Nunnery, MD   Consent:    Consent obtained:  Verbal   Consent given by:  Patient Procedure details:    Location:  R ear and L ear   Procedure type: curette, suction and forceps   Post-procedure details:    Inspection:  TM intact and canal normal   Hearing quality:  Improved   Patient tolerance of procedure:  Tolerated well, no immediate complications    Assessment: Cerumen impaction, bilateral Allergic rhinitis  Plan: Cerumen removed in office with subjective improvement in symptoms.  Patient describes some symptoms that may be related to eustachian tube dysfunction. She does have allergies. Prescribed Flonase, 2 sprays each nostril which should improve symptoms. She will return as needed.   Christin Hoffstadt, PA-C  I have personally seen and examined this patient. I agree with the assessment and plan as outlined above. Radene Journey, MD

## 2019-03-01 DIAGNOSIS — Z1231 Encounter for screening mammogram for malignant neoplasm of breast: Secondary | ICD-10-CM | POA: Diagnosis not present

## 2019-03-01 DIAGNOSIS — Z01419 Encounter for gynecological examination (general) (routine) without abnormal findings: Secondary | ICD-10-CM | POA: Diagnosis not present

## 2019-03-01 DIAGNOSIS — Z6822 Body mass index (BMI) 22.0-22.9, adult: Secondary | ICD-10-CM | POA: Diagnosis not present

## 2019-03-01 LAB — HM MAMMOGRAPHY

## 2019-03-11 ENCOUNTER — Encounter: Payer: Self-pay | Admitting: Internal Medicine

## 2019-04-08 ENCOUNTER — Ambulatory Visit: Payer: Self-pay | Admitting: Cardiology

## 2019-04-11 ENCOUNTER — Ambulatory Visit: Payer: BC Managed Care – PPO | Attending: Internal Medicine

## 2019-04-11 DIAGNOSIS — Z20822 Contact with and (suspected) exposure to covid-19: Secondary | ICD-10-CM

## 2019-04-12 LAB — NOVEL CORONAVIRUS, NAA: SARS-CoV-2, NAA: NOT DETECTED

## 2019-04-26 ENCOUNTER — Other Ambulatory Visit: Payer: Self-pay

## 2019-04-26 ENCOUNTER — Ambulatory Visit (INDEPENDENT_AMBULATORY_CARE_PROVIDER_SITE_OTHER): Payer: BC Managed Care – PPO | Admitting: Internal Medicine

## 2019-04-26 ENCOUNTER — Encounter: Payer: Self-pay | Admitting: Internal Medicine

## 2019-04-26 VITALS — BP 112/70 | HR 70 | Temp 97.6°F | Ht 63.5 in | Wt 131.2 lb

## 2019-04-26 DIAGNOSIS — R7989 Other specified abnormal findings of blood chemistry: Secondary | ICD-10-CM

## 2019-04-26 DIAGNOSIS — Z0000 Encounter for general adult medical examination without abnormal findings: Secondary | ICD-10-CM | POA: Diagnosis not present

## 2019-04-26 LAB — CBC WITH DIFFERENTIAL/PLATELET
Basophils Absolute: 0 10*3/uL (ref 0.0–0.1)
Basophils Relative: 0.4 % (ref 0.0–3.0)
Eosinophils Absolute: 0.1 10*3/uL (ref 0.0–0.7)
Eosinophils Relative: 1.2 % (ref 0.0–5.0)
HCT: 35.7 % — ABNORMAL LOW (ref 36.0–46.0)
Hemoglobin: 12.2 g/dL (ref 12.0–15.0)
Lymphocytes Relative: 50.1 % — ABNORMAL HIGH (ref 12.0–46.0)
Lymphs Abs: 2.9 10*3/uL (ref 0.7–4.0)
MCHC: 34.1 g/dL (ref 30.0–36.0)
MCV: 90.7 fl (ref 78.0–100.0)
Monocytes Absolute: 0.3 10*3/uL (ref 0.1–1.0)
Monocytes Relative: 5.7 % (ref 3.0–12.0)
Neutro Abs: 2.5 10*3/uL (ref 1.4–7.7)
Neutrophils Relative %: 42.6 % — ABNORMAL LOW (ref 43.0–77.0)
Platelets: 269 10*3/uL (ref 150.0–400.0)
RBC: 3.93 Mil/uL (ref 3.87–5.11)
RDW: 12.4 % (ref 11.5–15.5)
WBC: 5.8 10*3/uL (ref 4.0–10.5)

## 2019-04-26 LAB — BASIC METABOLIC PANEL
BUN: 10 mg/dL (ref 6–23)
CO2: 29 mEq/L (ref 19–32)
Calcium: 9.4 mg/dL (ref 8.4–10.5)
Chloride: 102 mEq/L (ref 96–112)
Creatinine, Ser: 0.54 mg/dL (ref 0.40–1.20)
GFR: 118.03 mL/min (ref >60.00)
Glucose, Bld: 84 mg/dL (ref 70–99)
Potassium: 4.5 mEq/L (ref 3.5–5.1)
Sodium: 137 mEq/L (ref 135–145)

## 2019-04-26 LAB — LIPID PANEL
Cholesterol: 181 mg/dL (ref 0–200)
HDL: 48.2 mg/dL (ref >39.00)
LDL Cholesterol: 105 mg/dL — ABNORMAL HIGH (ref 0–99)
NonHDL: 132.5
Total CHOL/HDL Ratio: 4
Triglycerides: 138 mg/dL (ref 0.0–149.0)
VLDL: 27.6 mg/dL (ref 0.0–40.0)

## 2019-04-26 LAB — HEPATIC FUNCTION PANEL
ALT: 11 U/L (ref 0–35)
AST: 14 U/L (ref 0–37)
Albumin: 4.3 g/dL (ref 3.5–5.2)
Alkaline Phosphatase: 64 U/L (ref 39–117)
Bilirubin, Direct: 0.1 mg/dL (ref 0.0–0.3)
Total Bilirubin: 0.4 mg/dL (ref 0.2–1.2)
Total Protein: 6.7 g/dL (ref 6.0–8.3)

## 2019-04-26 LAB — VITAMIN D 25 HYDROXY (VIT D DEFICIENCY, FRACTURES): VITD: 17.76 ng/mL — ABNORMAL LOW (ref 30.00–100.00)

## 2019-04-26 LAB — TSH: TSH: 2.12 u[IU]/mL (ref 0.35–4.50)

## 2019-04-26 NOTE — Patient Instructions (Signed)
Will notify you  of labs when available.    Health Maintenance, Female Adopting a healthy lifestyle and getting preventive care are important in promoting health and wellness. Ask your health care provider about:  The right schedule for you to have regular tests and exams.  Things you can do on your own to prevent diseases and keep yourself healthy. What should I know about diet, weight, and exercise? Eat a healthy diet   Eat a diet that includes plenty of vegetables, fruits, low-fat dairy products, and lean protein.  Do not eat a lot of foods that are high in solid fats, added sugars, or sodium. Maintain a healthy weight Body mass index (BMI) is used to identify weight problems. It estimates body fat based on height and weight. Your health care provider can help determine your BMI and help you achieve or maintain a healthy weight. Get regular exercise Get regular exercise. This is one of the most important things you can do for your health. Most adults should:  Exercise for at least 150 minutes each week. The exercise should increase your heart rate and make you sweat (moderate-intensity exercise).  Do strengthening exercises at least twice a week. This is in addition to the moderate-intensity exercise.  Spend less time sitting. Even light physical activity can be beneficial. Watch cholesterol and blood lipids Have your blood tested for lipids and cholesterol at 54 years of age, then have this test every 5 years. Have your cholesterol levels checked more often if:  Your lipid or cholesterol levels are high.  You are older than 54 years of age.  You are at high risk for heart disease. What should I know about cancer screening? Depending on your health history and family history, you may need to have cancer screening at various ages. This may include screening for:  Breast cancer.  Cervical cancer.  Colorectal cancer.  Skin cancer.  Lung cancer. What should I know about  heart disease, diabetes, and high blood pressure? Blood pressure and heart disease  High blood pressure causes heart disease and increases the risk of stroke. This is more likely to develop in people who have high blood pressure readings, are of African descent, or are overweight.  Have your blood pressure checked: ? Every 3-5 years if you are 18-39 years of age. ? Every year if you are 40 years old or older. Diabetes Have regular diabetes screenings. This checks your fasting blood sugar level. Have the screening done:  Once every three years after age 40 if you are at a normal weight and have a low risk for diabetes.  More often and at a younger age if you are overweight or have a high risk for diabetes. What should I know about preventing infection? Hepatitis B If you have a higher risk for hepatitis B, you should be screened for this virus. Talk with your health care provider to find out if you are at risk for hepatitis B infection. Hepatitis C Testing is recommended for:  Everyone born from 1945 through 1965.  Anyone with known risk factors for hepatitis C. Sexually transmitted infections (STIs)  Get screened for STIs, including gonorrhea and chlamydia, if: ? You are sexually active and are younger than 54 years of age. ? You are older than 54 years of age and your health care provider tells you that you are at risk for this type of infection. ? Your sexual activity has changed since you were last screened, and you are at increased   or gonorrhea. Ask your health care provider if you are at risk.  Ask your health care provider about whether you are at high risk for HIV. Your health care provider may recommend a prescription medicine to help prevent HIV infection. If you choose to take medicine to prevent HIV, you should first get tested for HIV. You should then be tested every 3 months for as long as you are taking the medicine. Pregnancy  If you are about to  stop having your period (premenopausal) and you may become pregnant, seek counseling before you get pregnant.  Take 400 to 800 micrograms (mcg) of folic acid every day if you become pregnant.  Ask for birth control (contraception) if you want to prevent pregnancy. Osteoporosis and menopause Osteoporosis is a disease in which the bones lose minerals and strength with aging. This can result in bone fractures. If you are 65 years old or older, or if you are at risk for osteoporosis and fractures, ask your health care provider if you should:  Be screened for bone loss.  Take a calcium or vitamin D supplement to lower your risk of fractures.  Be given hormone replacement therapy (HRT) to treat symptoms of menopause. Follow these instructions at home: Lifestyle  Do not use any products that contain nicotine or tobacco, such as cigarettes, e-cigarettes, and chewing tobacco. If you need help quitting, ask your health care provider.  Do not use street drugs.  Do not share needles.  Ask your health care provider for help if you need support or information about quitting drugs. Alcohol use  Do not drink alcohol if: ? Your health care provider tells you not to drink. ? You are pregnant, may be pregnant, or are planning to become pregnant.  If you drink alcohol: ? Limit how much you use to 0-1 drink a day. ? Limit intake if you are breastfeeding.  Be aware of how much alcohol is in your drink. In the [place] one drink equals one 12 oz bottle of beer (355 mL), one 5 oz glass of wine (148 mL), or one 1 oz glass of hard liquor (44 mL). General instructions  Schedule regular health, dental, and eye exams.  Stay current with your vaccines.  Tell your health care provider if: ? You often feel depressed. ? You have ever been abused or do not feel safe at home. Summary  Adopting a healthy lifestyle and getting preventive care are important in promoting health and wellness.  Follow your  health care provider's instructions about healthy diet, exercising, and getting tested or screened for diseases.  Follow your health care provider's instructions on monitoring your cholesterol and blood pressure. This information is not intended to replace advice given to you by your health care provider. Make sure you discuss any questions you have with your health care provider. Document Revised: 03/07/2018 Document Reviewed: 03/07/2018 Elsevier Patient Education  2020 [employer]  

## 2019-04-26 NOTE — Progress Notes (Signed)
This visit occurred during the SARS-CoV-2 public health emergency.  Safety protocols were in place, including screening questions prior to the visit, additional usage of staff PPE, and extensive cleaning of exam room while observing appropriate contact time as indicated for disinfecting solutions.    Chief Complaint  Patient presents with  . Annual Exam    HPI: Patient  Katie Lawson  54 y.o. comes in today for Preventive Health Care visit   obgyne   Pap etc done and utd  ? Exercise intolerance.  walking up hills no cp  Will see Dr Einar Gip for fu again since family hx of CV disease.  Derm     Has dark spots on face not getting better  Dr Benjie Karvonen gave sample  ? Triluma.2 weeks limit   askd about this  Taking vit d about 3 x per week      Health Maintenance  Topic Date Due  . TETANUS/TDAP  04/19/2019  . MAMMOGRAM  02/29/2020  . PAP SMEAR-Modifier  03/12/2021  . COLONOSCOPY  04/28/2025  . INFLUENZA VACCINE  Completed  . HIV Screening  Completed   Health Maintenance Review LIFESTYLE:  Exercise:   Decrease  Lost patterns  Better in winter. 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 No blood in the stool. No significant GU symptoms. SKIN/HEME: ,no acute skin rashes suspicious lesions or bleeding. No lymphadenopathy, nodules, masses.  NEURO/ PSYCH:  No neurologic signs such as weakness numbness. No depression anxiety. IMM/ Allergy: No unusual infections.  Allergy .   REST of 12 system review negative except as per HPI   Past Medical History:  Diagnosis Date  . Abnormal findings on diagnostic imaging of gall bladder    low  EF  . Elevated IOP    watch for glaucoma  . Fibroid, uterine    seen on ct  . Normal cardiac stress test    1 2015    History reviewed. No pertinent surgical history.  Family History  Problem Relation Age of Onset  . Hypertension Mother   . Healthy Daughter   . Healthy Daughter   . Breast cancer Unknown        GParent  . Heart attack Unknown        mgf [facility]cle      Outpatient Medications Prior to Visit  Medication Sig Dispense Refill  . cholecalciferol (VITAMIN D3) 25 MCG (1000 UNIT) tablet Take 1,000 Units by mouth daily. Taking 3 days per week    . fluticasone (FLONASE) 50 MCG/ACT nasal spray Place 2 sprays into both nostrils at bedtime. 16 g 6  . triamcinolone cream (KENALOG) 0.1 % Apply 1 application topically 2 (two) times daily. As directed to rash 30 g 0  . Vitamin D, Ergocalciferol, (DRISDOL) 50000 units CAPS capsule     . clindamycin-benzoyl peroxide (BENZACLIN) gel Apply to affected area 2 times daily for adult acne 25 g 3   No facility-administered medications prior to visit.     EXAM:  BP 112/70 (BP Location: Right Arm, Patient Position: Sitting, Cuff Size: Normal)   Pulse 70   Temp  97.6 F (36.4 C) (Temporal)   Ht 5' 3.5" (1.613 m)   Wt 131 lb 3.2 oz (59.5 kg)   SpO2 100%   BMI 22.88 kg/m   Body mass index is 22.88 kg/m. Wt Readings from Last 3 Encounters:  04/26/19 131 lb 3.2 oz (59.5 kg)  04/23/18 126 lb 12.8 oz (57.5 kg)  02/20/17 128 lb 9.6 oz (58.3 kg)    Physical Exam: Vital signs reviewed [id number] is a well-developed well-nourished alert cooperative    who appearsr stated age in no acute distress.  HEENT: normocephalic atraumatic , Eyes: PERRL EOM's full, conjunctiva clear, , Ears: no deformity EAC's clear TMs with normal landmarks left some wax . Mouth:masked  NECK: supple without masses, thyromegaly or bruits. CHEST/PULM:  Clear to auscultation and percussion breath sounds equal no wheeze , rales or rhonchi. No chest wall deformities  or tenderness. Breast: pergyne . CV: PMI is nondisplaced, S1 S2 no gallops, murmurs, rubs. Peripheral pulses are full without delay.No JVD .  ABDOMEN: Bowel sounds normal nontender  No guard or rebound, no hepato splenomegal no CVA tenderness.  No hernia. Extremtities:  No clubbing cyanosis or edema, no acute joint swelling or redness no focal atrophy NEURO:  Oriented x3, cranial nerves 3-12 appear to be intact, no obvious focal weakness,gait within normal limits no abnormal reflexes or asymmetrical SKIN: No acute rashes normal turgor, r, no bruising or petechiae.  Few hyperpigmented areas forehead   PSYCH: Oriented, good eye contact, no obvious depression anxiety, cognition and judgment appear normal. LN: no cervical axillary inguinal adenopathy  Lab Results  Component Value Date   WBC 5.8 04/26/2019   HGB 12.2 04/26/2019   HCT 35.7 (L) 04/26/2019   PLT 269.0 04/26/2019   GLUCOSE 84 04/26/2019   CHOL 181 04/26/2019   TRIG 138.0 04/26/2019   HDL 48.20 04/26/2019   LDLCALC 105 (H) 04/26/2019   ALT 11 04/26/2019   AST 14 04/26/2019   NA 137 04/26/2019   K 4.5 04/26/2019   CL 102 04/26/2019   CREATININE 0.54 04/26/2019   BUN 10 04/26/2019   CO2 29 04/26/2019   TSH 2.12 04/26/2019    BP Readings from Last 3 Encounters:  04/26/19 112/70  04/23/18 118/76  02/20/17 108/68      ASSESSMENT AND PLAN:  Discussed the following assessment and plan:    ICD-10-CM   1. Visit for preventive health examination  [id number] Basic metabolic panel    CBC with Differential/Platelet    Hepatic function panel    Lipid panel    TSH    VITAMIN D 25 Hydroxy (Vit-D Deficiency, Fractures)  2. Low vitamin D level  [id number] Basic metabolic panel    CBC with Differential/Platelet    Hepatic function panel    Lipid panel    TSH    VITAMIN D 25 Hydroxy (Vit-D Deficiency, Fractures)  facial hyperpigmentation post inflammatory vx melasma Agree  with   Cards  Check with subtle exercise changes  We can  order retin meds but insurance wont cover at her age  Patient Care Team: Serrita Lueth, Standley Brooking, MD as PCP - General (Internal Medicine) Azucena Fallen, MD as Attending Physician (Obstetrics and Gynecology) Juanita Craver, MD as Attending Physician (Gastroenterology) Adrian Prows, MD as Consulting Physician (Cardiology) Patient Instructions   Will notify you  of labs when available. Health Maintenance, Female Adopting a healthy lifestyle and getting preventive care are important in promoting health and wellness. Ask your health care provider about:  The right  schedule for you to have regular tests and exams.  Things you can do on your own to prevent diseases and keep yourself healthy. What should I know about diet, weight, and exercise? Eat a healthy diet   Eat a diet that includes plenty of vegetables, fruits, low-fat dairy products, and lean protein.  Do not eat a lot of foods that are high in solid fats, added sugars, or sodium. Maintain a healthy weight Body mass index (BMI) is used to identify weight problems. It estimates body fat based on height and weight. Your health care provider can help determine your BMI and help you achieve or maintain a healthy weight. Get regular exercise Get regular exercise. This is one of the most important things you can do for your health. Most adults should:  Exercise for at least 150 minutes each week. The exercise should increase your heart rate and make you sweat (moderate-intensity exercise).  Do strengthening exercises at least twice a week. This is in addition to the moderate-intensity exercise.  Spend less time sitting. Even light physical activity can be beneficial. Watch cholesterol and blood lipids Have your blood tested for lipids and cholesterol at 55 years of age, then have this test every 5 years. Have your cholesterol levels checked more often if:  Your lipid or cholesterol levels are high.  You are older than 54 years of age.  You  are at high risk for heart disease. What should I know about cancer screening? Depending on your health history and family history, you may need to have cancer screening at various ages. This may include screening for:  Breast cancer.  Cervical cancer.  Colorectal cancer.  Skin cancer.  Lung cancer. What should I know about heart disease, diabetes, and high blood pressure? Blood pressure and heart disease  High blood pressure causes heart disease and increases the risk of stroke. This is more likely to develop in people who have high blood pressure readings, are of African descent, or are overweight.  Have your blood pressure checked: ? Every 3-5 years if you are 14-60 years of age. ? Every year if you are 84 years old or older. Diabetes Have regular diabetes screenings. This checks your fasting blood sugar level. Have the screening done:  Once every three years after age 80 if you are at a normal weight and have a low risk for diabetes.  More often and at a younger age if you are overweight or have a high risk for diabetes. What should I know about preventing infection? Hepatitis B If you have a higher risk for hepatitis B, you should be screened for this virus. Talk with your health care provider to find out if you are at risk for hepatitis B infection. Hepatitis C Testing is recommended for:  Everyone born from [birth year] through 1965.  Anyone with known risk factors for hepatitis C. Sexually transmitted infections (STIs)  Get screened for STIs, including gonorrhea and chlamydia, if: ? You are sexually active and are younger than 53 years of age. ? You are older than 54 years of age and your health care provider tells you that you are at risk for this type of infection. ? Your sexual activity has changed since you were last screened, and you are at increased risk for chlamydia or gonorrhea. Ask your health care provider if you are at risk.  Ask your health care provider about  whether you are at high risk for HIV. Your health care provider may recommend a  prescription medicine to help prevent HIV infection. If you choose to take medicine to prevent HIV, you should first get tested for HIV. You should then be tested every 3 months for as long as you are taking the medicine. Pregnancy  If you are about to stop having your period (premenopausal) and you may become pregnant, seek counseling before you get pregnant.  Take 400 to 800 micrograms (mcg) of folic acid every day if you become pregnant.  Ask for birth control (contraception) if you want to prevent pregnancy. Osteoporosis and menopause Osteoporosis is a disease in which the bones lose minerals and strength with aging. This can result in bone fractures. If you are 99 years old or older, or if you are at risk for osteoporosis and fractures, ask your health care provider if you should:  Be screened for bone loss.  Take a calcium or vitamin D supplement to lower your risk of fractures.  Be given hormone replacement therapy (HRT) to treat symptoms of menopause. Follow these instructions at home: Lifestyle  Do not use any products that contain nicotine or tobacco, such as cigarettes, e-cigarettes, and chewing tobacco. If you need help quitting, ask your health care provider.  Do not use street drugs.  Do not share needles.  Ask your health care provider for help if you need support or information about quitting drugs. Alcohol use  Do not drink alcohol if: ? Your health care provider tells you not to drink. ? You are pregnant, may be pregnant, or are planning to become pregnant.  If you drink alcohol: ? Limit how much you use to 0-1 drink a day. ? Limit intake if you are breastfeeding.  Be aware of how much alcohol is in your drink. In the [place] one drink equals one 12 oz bottle of beer (355 mL), one 5 oz glass of wine (148 mL), or one 1 oz glass of hard liquor (44 mL). General instructions  Schedule  regular health, dental, and eye exams.  Stay current with your vaccines.  Tell your health care provider if: ? You often feel depressed. ? You have ever been abused or do not feel safe at home. Summary  Adopting a healthy lifestyle and getting preventive care are important in promoting health and wellness.  Follow your health care provider's instructions about healthy diet, exercising, and getting tested or screened for diseases.  Follow your health care provider's instructions on monitoring your cholesterol and blood pressure. This information is not intended to replace advice given to you by your health care provider. Make sure you discuss any questions you have with your health care provider. Document Revised: 03/07/2018 Document Reviewed: 03/07/2018 Elsevier Patient Education  2020 [place] Kristiana Jacko M.D.

## 2019-04-30 NOTE — Progress Notes (Signed)
Vit D low    increase as possible to  2000 per day or even 1000 iu every day instead of 3 x per week

## 2019-05-20 ENCOUNTER — Other Ambulatory Visit: Payer: Self-pay

## 2019-05-20 ENCOUNTER — Ambulatory Visit: Payer: BC Managed Care – PPO | Admitting: Cardiology

## 2019-05-20 ENCOUNTER — Encounter: Payer: Self-pay | Admitting: Cardiology

## 2019-05-20 VITALS — BP 118/61 | HR 79 | Temp 97.2°F | Resp 16 | Ht 63.5 in | Wt 138.0 lb

## 2019-05-20 DIAGNOSIS — R0602 Shortness of breath: Secondary | ICD-10-CM | POA: Diagnosis not present

## 2019-05-20 NOTE — Progress Notes (Signed)
Primary Physician/Referring:  Burnis Medin, MD  Patient ID: Katie Lawson, female    DOB: 07/03/1965, 54 y.o.   MRN: [id number]  Chief Complaint  Patient presents with  . Hyperlipidemia  . New Patient (Initial Visit)  . Shortness of Breath   HPI:    Katie Lawson  is a 54 y.o. [place] female with no significant medical history, has maternal uncles with #2 coronary artery disease but her parents or her siblings do not have any significant cardiac history.  Over the past 2 to 3 months she has noticed gradually worsening dyspnea and hence wanted to see me.  No no recent changes.  Chest pain.  Past Medical History:  Diagnosis Date  . Abnormal findings on diagnostic imaging of gall bladder    low EF  . Elevated IOP    watch for glaucoma  . Fibroid, uterine    seen on ct  . Normal cardiac stress test    1 2015   History reviewed. No pertinent surgical history. Family History  Problem Relation Age of Onset  . Hypertension Mother   . Liver disease Father   . Healthy Daughter   . Healthy Daughter   . Breast cancer Other        GParent  . Heart attack Other        [facility]f [facility]    Social History   Tobacco Use  . Smoking status: Never Smoker  . Smokeless tobacco: Never Used  Substance Use Topics  . Alcohol use: No   ROS  Review of Systems  Cardiovascular: Positive for dyspnea on exertion. Negative for leg swelling.  Gastrointestinal: Negative for melena.   Objective  Blood pressure 118/61, pulse 79, temperature (!) 97.2 F (36.2 C), temperature source Temporal, resp. rate 16, height 5' 3.5" (1.613 m), weight 138 lb (62.6 kg), SpO2 98 %.  Vitals with BMI 05/20/2019 04/26/2019 04/23/2018  Height 5' 3.5" 5' 3.5" -  Weight 138 lbs 131 lbs 3 oz 126 lbs 13 oz  BMI 24.46 28.63 -  Systolic 817 [id number] [id number]  Diastolic 61 70 76  Pulse 79 70 82     Physical Exam  Cardiovascular: Normal rate, regular rhythm, normal heart sounds and intact distal pulses.  Exam reveals no gallop.  No murmur heard. No leg edema, no JVD.  Pulmonary/Chest: Effort normal and breath sounds normal.  Abdominal: Soft. Bowel sounds are normal.  Skin: Skin is warm and dry.   Laboratory examination:   Recent Labs    04/26/19 1134  NA 137  K 4.5  CL 102  CO2 29  GLUCOSE 84  BUN 10  CREATININE 0.54  CALCIUM 9.4   CrCl cannot be calculated (Patient's most recent lab result is older than the maximum 21 days allowed.).  CMP Latest Ref Rng & Units 04/26/2019 04/23/2018 02/20/2017  Glucose 70 - 99 mg/dL 84 74 84  BUN 6 - 23 mg/dL 10 12 11   Creatinine 0.40 - 1.20 mg/dL 0.54 0.64 0.57  Sodium 135 - 145 mEq/L 137 137 138  Potassium 3.5 - 5.1 mEq/L 4.5 3.9 4.2  Chloride 96 - 112 mEq/L 102 97 104  CO2 19 - 32 mEq/L 29 31 28   Calcium 8.4 - 10.5 mg/dL 9.4 10.1 9.0  Total Protein 6.0 - 8.3 g/dL 6.7 7.3 6.5  Total Bilirubin 0.2 - 1.2 mg/dL 0.4 0.7 0.4  Alkaline Phos 39 - 117 U/L 64 56 41  AST 0 - 37 U/L 14 17 17  ALT 0 - 35 U/L 11 16 23    CBC Latest Ref Rng & Units 04/26/2019 04/23/2018 02/20/2017  WBC 4.0 - 10.5 K/uL 5.8 6.4 4.6  Hemoglobin 12.0 - 15.0 g/dL 12.2 12.3 12.4  Hematocrit 36.0 - 46.0 % 35.7(L) 36.2 36.8  Platelets 150.0 - 400.0 K/uL 269.0 267.0 231.0   Lipid Panel     Component Value Date/Time   CHOL 181 04/26/2019 1134   TRIG 138.0 04/26/2019 1134   HDL 48.20 04/26/2019 1134   CHOLHDL 4 04/26/2019 1134   VLDL 27.6 04/26/2019 1134   LDLCALC 105 (H) 04/26/2019 1134       NHDL                                                             133  HEMOGLOBIN A1C No results found for: HGBA1C, MPG TSH Recent Labs    04/26/19 1134  TSH 2.12   Medications and allergies  No Known Allergies   Current Outpatient Medications  Medication Instructions  . cholecalciferol (VITAMIN D3) 1,000 Units, Oral, Daily, Taking 3 days per week    Radiology:   No results found.  Cardiac Studies:   Cardiac calcium scoring 06/19/2013: Calcium score of  0.  Treadmill exercise stress 04/17/2012: Indications: Assessment of Chest Pain. Symptoms: MPHR met. Fatigue. Arrhythmia: None The patient exercised according to the Bruce protocol, Total time recorded 7 Min. 31 sec. achieving a max heart rate of 178 which was 102% of MPHR for age and 8.7 METS of work. Baseline NIBP was 118/78. Peak NIBP was 140/60 MaxSysp was: 140 MaxDiasp was: 80. The baseline ECG showed NSR,Non specific T change. During exercise there was No ST-T chnages of ischemia. Conclusions: Negative for ischemia. Normal test. Continue primary prevention. Evaluate for non cardiac chest pain if persists  Assessment     ICD-10-CM   1. Shortness of breath  R06.02 EKG 12-Lead    PCV ECHOCARDIOGRAM COMPLETE    DG Chest 2 View    EKG 05/20/2019: Normal sinus rhythm with rate of 68 bpm, normal axis, no evidence of ischemia, normal EKG.     No orders of the defined types were placed in this encounter.   There are no discontinued medications.   Recommendations:   Katie Lawson  is a 54 y.o. [place] female with no significant medical history, has maternal uncles with #2 coronary artery disease but her parents or her siblings do not have any significant cardiac history.  Over the past 2 to 3 months she has noticed gradually worsening dyspnea.  Physical examination is unremarkable, EKG is also normal.  I suspect it could be probably deconditioning but would like to obtain an echocardiogram to exclude any structural abnormality.  She has had a routine treadmill stress test about 5 years ago we could consider repeating the test but however I advised her to increase her physical activity and to watch and if symptoms do not resolve over the next 3 to 4 months, will consider a treadmill stress test.  Obviously if her symptoms getting worse she will contact me sooner.  I will also obtain chest x-ray PA and lateral view.    I discussed with her regarding her lipids.  She has very  mild hyperlipidemia, non-HDL cholesterol is also minimally elevated.  I  discussed regarding flaxseed flakes and also to use Metamucil on a regular basis and will repeat lipids in 6 months prior to her next office visit with me.  I do not think she needs to be on a statin in the absence of any other cardiovascular risk factors.  At this point watchful waiting for now.  Adrian Prows, MD, [facility] 05/20/2019, 2:46 PM [facility]. [place] Office: [phone]

## 2019-06-03 ENCOUNTER — Ambulatory Visit: Payer: BC Managed Care – PPO

## 2019-06-03 ENCOUNTER — Other Ambulatory Visit: Payer: Self-pay

## 2019-06-03 DIAGNOSIS — R0602 Shortness of breath: Secondary | ICD-10-CM | POA: Diagnosis not present

## 2019-06-24 ENCOUNTER — Ambulatory Visit
Admission: RE | Admit: 2019-06-24 | Discharge: 2019-06-24 | Disposition: A | Discharge status: Home / Self Care | Payer: BC Managed Care – PPO | Source: Ambulatory Visit | Attending: Cardiology | Admitting: Cardiology

## 2019-06-24 ENCOUNTER — Other Ambulatory Visit: Payer: Self-pay

## 2019-06-24 DIAGNOSIS — R0600 Dyspnea, unspecified: Secondary | ICD-10-CM | POA: Diagnosis not present

## 2019-06-24 DIAGNOSIS — R0602 Shortness of breath: Secondary | ICD-10-CM

## 2019-06-30 NOTE — Progress Notes (Signed)
I have discussed with the patient regarding the findings of the echocardiogram and also chest x-ray.  Her dyspnea may be related to mild exercise-induced asthma versus GERD.  She can try OTC zyrtec or PPI.  I have again discussed with her regarding mild elevated LDL.  She will see me back in 6 months unless symptoms are persistent, I could consider repeating treadmill stress test.

## 2019-10-24 ENCOUNTER — Other Ambulatory Visit: Payer: Self-pay

## 2019-10-24 ENCOUNTER — Emergency Department (HOSPITAL_COMMUNITY)
Admission: EM | Admit: 2019-10-24 | Discharge: 2019-10-24 | Disposition: A | Discharge status: Home / Self Care | Payer: BC Managed Care – PPO | Attending: Emergency Medicine | Admitting: Emergency Medicine

## 2019-10-24 DIAGNOSIS — H538 Other visual disturbances: Secondary | ICD-10-CM | POA: Insufficient documentation

## 2019-10-24 DIAGNOSIS — R519 Headache, unspecified: Secondary | ICD-10-CM | POA: Diagnosis not present

## 2019-10-24 DIAGNOSIS — R42 Dizziness and giddiness: Secondary | ICD-10-CM | POA: Diagnosis not present

## 2019-10-24 DIAGNOSIS — R030 Elevated blood-pressure reading, without diagnosis of hypertension: Secondary | ICD-10-CM | POA: Diagnosis not present

## 2019-10-24 DIAGNOSIS — I10 Essential (primary) hypertension: Secondary | ICD-10-CM | POA: Diagnosis not present

## 2019-10-24 DIAGNOSIS — M542 Cervicalgia: Secondary | ICD-10-CM | POA: Insufficient documentation

## 2019-10-24 LAB — CBC
HCT: 37.6 % (ref 36.0–46.0)
Hemoglobin: 12.7 g/dL (ref 12.0–15.0)
MCH: 30.7 pg (ref 26.0–34.0)
MCHC: 33.8 g/dL (ref 30.0–36.0)
MCV: 90.8 fL (ref 80.0–100.0)
Platelets: 262 10*3/uL (ref 150–400)
RBC: 4.14 MIL/uL (ref 3.87–5.11)
RDW: 11.8 % (ref 11.5–15.5)
WBC: 6.7 10*3/uL (ref 4.0–10.5)
nRBC: 0 % (ref 0.0–0.2)

## 2019-10-24 LAB — BASIC METABOLIC PANEL
Anion gap: 10 (ref 5–15)
BUN: 15 mg/dL (ref 6–20)
CO2: 28 mmol/L (ref 22–32)
Calcium: 9.6 mg/dL (ref 8.9–10.3)
Chloride: 103 mmol/L (ref 98–111)
Creatinine, Ser: 0.7 mg/dL (ref 0.44–1.00)
GFR calc Af Amer: >60 mL/min (ref >60)
GFR calc non Af Amer: >60 mL/min (ref >60)
Glucose, Bld: 99 mg/dL (ref 70–99)
Potassium: 4.1 mmol/L (ref 3.5–5.1)
Sodium: 141 mmol/L (ref 135–145)

## 2019-10-24 NOTE — ED Notes (Signed)
Pt discharged home per MD order. Discharge summary reviewed with pt, pt verbalizes understanding. Ambulatory off unit- no s/s of acute distress noted. Reports discharge ride home.

## 2019-10-24 NOTE — ED Provider Notes (Addendum)
[facility] EMERGENCY DEPARTMENT Provider Note   CSN: [id number] Arrival date & time: 10/24/19  2426     History No chief complaint on file.   Katie Lawson is a 54 y.o. female who presents emergency department chief complaint of hypertension.  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  She also felt like she just could not get her words out well..  She states that she sat down and one of her colleagues took her blood pressure which was elevated to about 140/90.  She has no history of hypertension.  Patient states that she just "was not feeling very well" states that she had trouble thinking straight for a bit of time.  Today when the patient awoke she states that "I just felt heavy, my tongue felt heavy, I try to go to the urgent care but they were closed so came here."  She states that she did not feel anxious at all.  She also complains of pain in the right posterior neck which is worse with movement better with rest.  She denies racing or skipping in her heart.  She has a cardiologist with whom she has had a full work-up including echocardiogram recently.  She states that sometimes she feels a bit breathless when she is exerting herself or walking and talking at the same time.  She has had her thyroid levels checked within the last year.  HPI     Past Medical History:  Diagnosis Date  . Abnormal findings on diagnostic imaging of gall bladder    low EF  . Elevated IOP    watch for glaucoma  . Fibroid, uterine    seen on ct  . Normal cardiac stress test    1 2015    Patient Active Problem List   Diagnosis Date Noted  . Mild anemia 04/18/2012  . Visit for preventive health examination 02/24/2011  . Adult acne 02/24/2011  . Fibroids 02/24/2011  . Elevated IOP   . Abnormal findings on diagnostic  imaging of gall bladder     No past surgical history on file.   OB History   No obstetric history on file.     Family History  Problem Relation Age of Onset  . Hypertension Mother   . Liver disease Father   . Healthy Daughter   . Healthy Daughter   . Breast cancer Other        GParent  . Heart attack Other        [facility]    Social History   Tobacco Use  . Smoking status: Never Smoker  . Smokeless tobacco: Never Used  Substance Use Topics  . Alcohol use: No  . Drug use: Never    Home Medications Prior to Admission medications   Medication Sig Start Date End Date Taking? Authorizing Provider  cholecalciferol (VITAMIN D3) 25 MCG (1000 UNIT) tablet Take 1,000 Units by mouth daily. Taking 3 days per week    [provider]    Allergies    Patient has no known allergies.  Review of Systems   Review of Systems Ten systems reviewed and are negative for acute change, except as noted in the HPI.   Physical Exam Updated Vital Signs BP 125/65 (BP Location: Left Arm)   Pulse 74   Temp 98.9 F (37.2 C) (  Oral)   Resp 15   Ht 5\' 3"  (1.6 m)   Wt 59 kg   LMP 11/23/2017 (Approximate)   SpO2 97%   BMI 23.03 kg/m   Physical Exam Vitals and nursing note reviewed.  Constitutional:      General: She is not in acute distress.    Appearance: She is well-developed. She is not diaphoretic.  HENT:     Head: Normocephalic and atraumatic.  Eyes:     General: No scleral icterus.    Conjunctiva/sclera: Conjunctivae normal.  Cardiovascular:     Rate and Rhythm: Normal rate and regular rhythm.     Heart sounds: Normal heart sounds. No murmur heard.  No friction rub. No gallop.   Pulmonary:     Effort: Pulmonary effort is normal. No respiratory distress.     Breath sounds: Normal breath sounds.  Abdominal:     General: Bowel sounds are normal. There is no distension.     Palpations: Abdomen is soft. There is no mass.     Tenderness: There is no abdominal  tenderness. There is no guarding.  Musculoskeletal:     Cervical back: Normal range of motion and neck supple.  Skin:    General: Skin is warm and dry.  Neurological:     General: No focal deficit present.     Mental Status: She is alert. Mental status is at baseline. She is disoriented.     Cranial Nerves: No cranial nerve deficit.     Sensory: No sensory deficit.     Motor: No weakness.     Coordination: Coordination normal.  Psychiatric:        Behavior: Behavior normal.     ED Results / Procedures / Treatments   Labs (all labs ordered are listed, but only abnormal results are displayed) Labs Reviewed  CBC  BASIC METABOLIC PANEL    EKG None  Radiology No results found.  Procedures Procedures (including critical care time)  Medications Ordered in ED Medications - No data to display  ED Course  I have reviewed the triage vital signs and the nursing notes.  Pertinent labs & imaging results that were available during my care of the patient were reviewed by me and considered in my medical decision making (see chart for details).    MDM Rules/Calculators/A&P                           54 year old female with transient elevation in blood pressure.  She had some feelings of lightheadedness and blurry vision.  I do not think that this represents TIA.  Patient is normotensive here in the emergency department.  She had full work-up with cardiology within the last year that was within normal limits which is reassuring.  Recent yearly complete physical without significant abnormalities including normal TSH level.  Patient is advised to follow closely with her primary care physician.  Written return precautions given and reviewed.  She appears otherwise appropriate for discharge at this time  Final Clinical Impression(s) / ED Diagnoses Final diagnoses:  None    Rx / DC Orders ED Discharge Orders    None       Margarita Mail, PA-C 10/24/19 1028    Margarita Mail,  PA-C 10/24/19 1130    Davonna Belling, MD 10/24/19 [phone]

## 2019-10-24 NOTE — Discharge Instructions (Addendum)
Contact a health care provider if:  Your dizziness does not go away.  Your dizziness or light-headedness gets worse.  You feel nauseous.  You have reduced hearing.  You have new symptoms.  You are unsteady on your feet or you feel like the room is spinning.  Get help right away if:  You vomit or have diarrhea and are unable to eat or drink anything.  You have problems talking, walking, swallowing, or using your arms, hands, or legs.  You feel generally weak.  You are not thinking clearly or you have trouble forming sentences. It may take a friend or family member to notice this.  You have chest pain, abdominal pain, shortness of breath, or sweating.  Your vision changes.  You have any bleeding.  You have a severe headache.  You have neck pain or a stiff neck.  You have a fever.

## 2019-10-24 NOTE — ED Triage Notes (Signed)
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 She sat down and symptoms resolved, however she is concerned for her blood pressure because the readings at that time were 133/94, 123/92 and this morning was 146/90. Sts this morning her tongue felt swollen and she felt heavy all over. C/o neck pain and posterior headache.

## 2019-11-04 NOTE — Telephone Encounter (Signed)
From pt

## 2019-11-18 ENCOUNTER — Encounter: Payer: Self-pay | Admitting: Cardiology

## 2019-11-18 ENCOUNTER — Other Ambulatory Visit: Payer: Self-pay

## 2019-11-18 ENCOUNTER — Ambulatory Visit: Payer: BC Managed Care – PPO | Admitting: Cardiology

## 2019-11-18 VITALS — BP 136/83 | HR 58 | Resp 16 | Ht 63.0 in | Wt 132.0 lb

## 2019-11-18 DIAGNOSIS — R0602 Shortness of breath: Secondary | ICD-10-CM

## 2019-11-18 DIAGNOSIS — R0989 Other specified symptoms and signs involving the circulatory and respiratory systems: Secondary | ICD-10-CM

## 2019-11-18 DIAGNOSIS — R42 Dizziness and giddiness: Secondary | ICD-10-CM | POA: Diagnosis not present

## 2019-11-18 DIAGNOSIS — E785 Hyperlipidemia, unspecified: Secondary | ICD-10-CM

## 2019-11-18 MED ORDER — OLMESARTAN MEDOXOMIL 20 MG PO TABS: 20.0000 mg | ORAL_TABLET | ORAL | 2 refills | Status: DC

## 2019-11-18 NOTE — Progress Notes (Signed)
Primary Physician/Referring:  Burnis Medin, MD  Patient ID: Katie Lawson, female    DOB: May 30, 1965, 54 y.o.   MRN: [id number]  Chief Complaint  Patient presents with   Shortness of Breath   Follow-up    6 month   HPI:    Katie Lawson  is a 54 y.o. Asian [place] female with no significant medical history, has maternal uncles with #2 coronary artery disease but her parents or her siblings do not have any significant cardiac history.    Lasts een for gradually worsening dyspnea 6 months ago. Suspected at that time to be due to deconditioning as exam was unremarkable and EKG was normal. Obtained an echo to evaluate for structural heart disease. Also encouraged her at that time to use flaxseed flakes and Metamucil on regular basis to improve lipids. Chest x-ray was also ordered at last visit and revealed no cardiopulmonary disease.    She was seen in the emergency room on 10/24/2019 when she developed mild dizziness, felt she had mild dysarthria and got nervous and presented to the emergency room and told that her blood pressure was recorded to be 140/95 mmHg.  On examination in the ED she had normal blood pressure, EKG was essentially normal, she was discharged home.  She has not had any recurrence of these events, although she had been concerned about elevated blood pressure, she has been recording her blood pressure and has recorded blood pressure as high as 140/105 mmHg.  Over the last 1 week her blood pressure has now normalized and daily recordings reveal blood pressure to be well controlled around 110/70 mmHg.  She still concerned about some mild dizziness.  Past Medical History:  Diagnosis Date   Abnormal findings on diagnostic imaging of gall bladder    low EF   Elevated IOP    watch for glaucoma   Fibroid, uterine    seen on ct   Normal cardiac stress test    1 2015   History reviewed. No pertinent surgical history. Family History  Problem Relation  Age of Onset   Hypertension Mother    Liver disease Father    Healthy Daughter    Healthy Daughter    Breast cancer Other        GParent   Heart attack Other        [facility] [employer]    Social History   Tobacco Use   Smoking status: Never Smoker   Smokeless tobacco: Never Used  Substance Use Topics   Alcohol use: No   ROS  Review of Systems  Cardiovascular: Positive for dyspnea on exertion. Negative for leg swelling.  Gastrointestinal: Negative for melena.   Objective  Blood pressure 136/83, pulse (!) 58, resp. rate 16, height 5' 3"  (1.6 m), weight 132 lb (59.9 kg), SpO2 100 %.  Vitals with BMI 11/18/2019 10/24/2019 10/24/2019  Height 5' 3"  - -  Weight 132 lbs - -  BMI 50.56 - -  Systolic 979 480 165  Diastolic 83 85 65  Pulse 58 69 74     Physical Exam Cardiovascular:     Rate and Rhythm: Normal rate and regular rhythm.     Pulses: Intact distal pulses.     Heart sounds: Normal heart sounds. No murmur heard.  No gallop.      Comments: No leg edema, no JVD. Pulmonary:     Effort: Pulmonary effort is normal.     Breath sounds: Normal breath sounds.  Abdominal:  General: Bowel sounds are normal.     Palpations: Abdomen is soft.  Skin:    General: Skin is warm and dry.    Laboratory examination:   Recent Labs    04/26/19 1134 10/24/19 0724  NA 137 141  K 4.5 4.1  CL 102 103  CO2 29 28  GLUCOSE 84 99  BUN 10 15  CREATININE 0.54 0.70  CALCIUM 9.4 9.6  GFRNONAA  --  >60  GFRAA  --  >60   CrCl cannot be calculated (Patient's most recent lab result is older than the maximum 21 days allowed.).  CMP Latest Ref Rng & Units 10/24/2019 04/26/2019 04/23/2018  Glucose 70 - 99 mg/dL 99 84 74  BUN 6 - 20 mg/dL 15 10 12   Creatinine 0.44 - 1.00 mg/dL 0.70 0.54 0.64  Sodium 135 - 145 mmol/L 141 137 137  Potassium 3.5 - 5.1 mmol/L 4.1 4.5 3.9  Chloride 98 - 111 mmol/L 103 102 97  CO2 22 - 32 mmol/L 28 29 31   Calcium 8.9 - 10.3 mg/dL 9.6 9.4 10.1  Total Protein  6.0 - 8.3 g/dL - 6.7 7.3  Total Bilirubin 0.2 - 1.2 mg/dL - 0.4 0.7  Alkaline Phos 39 - 117 U/L - 64 56  AST 0 - 37 U/L - 14 17  ALT 0 - 35 U/L - 11 16   CBC Latest Ref Rng & Units 10/24/2019 04/26/2019 04/23/2018  WBC 4.0 - 10.5 K/uL 6.7 5.8 6.4  Hemoglobin 12.0 - 15.0 g/dL 12.7 12.2 12.3  Hematocrit 36 - 46 % 37.6 35.7(L) 36.2  Platelets 150 - 400 K/uL 262 269.0 267.0   Lipid Panel     Component Value Date/Time   CHOL 181 04/26/2019 1134   TRIG 138.0 04/26/2019 1134   HDL 48.20 04/26/2019 1134   CHOLHDL 4 04/26/2019 1134   VLDL 27.6 04/26/2019 1134   LDLCALC 105 (H) 04/26/2019 1134       NHDL                                                             133  HEMOGLOBIN A1C No results found for: HGBA1C, MPG TSH Recent Labs    04/26/19 1134  TSH 2.12   Medications and allergies  No Known Allergies   Current Outpatient Medications  Medication Instructions   cholecalciferol (VITAMIN D3) 3,000 Units, Oral, Weekly   olmesartan (BENICAR) 20 mg, Oral, As directed, 1/2 tablet twice weekly   Radiology:   No results found.  Chest X-Ray 06/24/2019:  The heart size and mediastinal contours are within normal limits. Both lungs are clear. The visualized skeletal structures are unremarkable IMPRESSION: Normal exam.  Cardiac Studies:   Echocardiogram 06/05/2019:  Left ventricle cavity is normal in size and wall thickness. Normal global wall motion. Normal LV systolic function with EF 55%. Normal diastolic filling pattern.  Mild tricuspid regurgitation.  No evidence of pulmonary hypertension.  Cardiac calcium scoring 06/19/2013: Calcium score of 0.  Treadmill exercise stress 04/17/2012: Indications: Assessment of Chest Pain. Symptoms: MPHR met. Fatigue. Arrhythmia: None The patient exercised according to the Bruce protocol, Total time recorded 7 Min. 31 sec. achieving a max heart rate of 178 which was 102% of MPHR for age and 8.7 METS of work. Baseline NIBP was 118/78. Peak  NIBP was 140/60 MaxSysp was: 140 MaxDiasp was: 80. The baseline ECG showed NSR,Non specific T change. During exercise there was No ST-T chnages of ischemia. Conclusions: Negative for ischemia. Normal test. Continue primary prevention. Evaluate for non cardiac chest pain if persists.  EKG  EKG 11/18/2019: Normal sinus rhythm with rate of 76 bpm, normal axis.  Nonspecific T abnormality.  No evidence of ischemia.    EKG 05/20/2019: Normal sinus rhythm with rate of 68 bpm, normal axis, no evidence of ischemia, normal EKG.     Assessment     ICD-10-CM   1. Shortness of breath  R06.02 EKG 12-Lead    PCV CARDIAC STRESS TEST  2. Hyperlipidemia, mild  E78.5   3. Labile hypertension  R09.89 PCV CARDIAC STRESS TEST    olmesartan (BENICAR) 20 MG tablet  4. Dizziness and giddiness  R42 PCV CAROTID DUPLEX (BILATERAL)     Meds ordered this encounter  Medications   olmesartan (BENICAR) 20 MG tablet    Sig: Take 1 tablet (20 mg total) by mouth as directed. 1/2 tablet twice weekly    Dispense:  30 tablet    Refill:  2    There are no discontinued medications.   Recommendations:   Katie Lawson  is a 54 y.o. [place] female with no significant medical history, has maternal uncles with #2 coronary artery disease but her parents or her siblings do not have any significant cardiac history.  I seen her in February 2021 for gradually worsening dyspnea, echocardiogram was normal and chest x-ray was normal, coronary calcium score was 0.  She still persistent mild dyspnea on exertion.  Seen in the emergency room on 10/24/2019 with dizziness and elevated blood pressure.  I suspect she probably has labile hypertension.  Her blood pressure has now normalized as per home recordings for the past 1 week although it was slightly elevated in office today.  Advised her we can try olmesartan 20 mg 1/2 tablet twice a week with close monitoring of her symptoms.  She is concerned about dizziness, concern  about carotid disease, in view of mild hyperlipidemia and her symptoms, I will obtain carotid duplex.  I do not think she had TIA.  With regard to her dyspnea, I will also set her up for a routine treadmill exercise stress test.  EKG does reveal nonspecific T abnormality.  Unless abnormal, I will see her back in 1 year.  I would have a low threshold to start her on a statin therapy especially if carotid duplex reveals any significant plaque.  Encouraged her to walk on a daily basis.   Adrian Prows, MD, [facility] 11/18/2019, 1:41 PM Office: [phone]

## 2019-11-25 ENCOUNTER — Other Ambulatory Visit: Payer: Self-pay

## 2019-11-25 ENCOUNTER — Ambulatory Visit: Payer: BC Managed Care – PPO

## 2019-11-25 DIAGNOSIS — R0602 Shortness of breath: Secondary | ICD-10-CM

## 2019-11-25 DIAGNOSIS — R0989 Other specified symptoms and signs involving the circulatory and respiratory systems: Secondary | ICD-10-CM

## 2019-11-28 DIAGNOSIS — Z03818 Encounter for observation for suspected exposure to other biological agents ruled out: Secondary | ICD-10-CM | POA: Diagnosis not present

## 2019-12-06 ENCOUNTER — Ambulatory Visit: Payer: BC Managed Care – PPO

## 2019-12-06 ENCOUNTER — Other Ambulatory Visit: Payer: Self-pay

## 2019-12-06 DIAGNOSIS — R42 Dizziness and giddiness: Secondary | ICD-10-CM

## 2019-12-08 DIAGNOSIS — E785 Hyperlipidemia, unspecified: Secondary | ICD-10-CM

## 2019-12-08 DIAGNOSIS — R42 Dizziness and giddiness: Secondary | ICD-10-CM

## 2019-12-08 DIAGNOSIS — R0602 Shortness of breath: Secondary | ICD-10-CM

## 2019-12-16 ENCOUNTER — Encounter: Payer: Self-pay | Admitting: Neurology

## 2019-12-30 ENCOUNTER — Ambulatory Visit
Admission: RE | Admit: 2019-12-30 | Discharge: 2019-12-30 | Disposition: A | Discharge status: Home / Self Care | Payer: No Typology Code available for payment source | Source: Ambulatory Visit | Attending: Cardiology | Admitting: Cardiology

## 2019-12-30 DIAGNOSIS — R0602 Shortness of breath: Secondary | ICD-10-CM

## 2019-12-30 DIAGNOSIS — Z136 Encounter for screening for cardiovascular disorders: Secondary | ICD-10-CM | POA: Diagnosis not present

## 2019-12-30 DIAGNOSIS — E785 Hyperlipidemia, unspecified: Secondary | ICD-10-CM

## 2020-01-08 ENCOUNTER — Other Ambulatory Visit: Payer: Self-pay | Admitting: Cardiology

## 2020-01-08 ENCOUNTER — Other Ambulatory Visit: Payer: Self-pay

## 2020-01-08 ENCOUNTER — Telehealth: Payer: Self-pay

## 2020-01-08 DIAGNOSIS — R0989 Other specified symptoms and signs involving the circulatory and respiratory systems: Secondary | ICD-10-CM

## 2020-01-08 MED ORDER — OLMESARTAN MEDOXOMIL 20 MG PO TABS: 20.0000 mg | ORAL_TABLET | ORAL | 3 refills | Status: AC

## 2020-01-08 NOTE — Telephone Encounter (Signed)
Patient called requesting a refill on her Olmesartan patient is taking 1/2 tab three times weekly but in her chart it says 2 times weekly please advise.

## 2020-02-28 NOTE — Progress Notes (Addendum)
NEUROLOGY CONSULTATION NOTE  Katie Lawson MRN: [id number] DOB: [birth year]  Referring provider: Shanon Ace, MD Primary care provider: Shanon Ace, MD  Reason for consult:  dizziness   Subjective:  Katie Lawson is a 54 year old right-handed female with no significant past medical history who presents for dizziness.  History supplemented by cardiology and ED notes.  At work and therapist on-line and walls started to move.  No dizziness or feeling going to fall.  Sat down and blood pressure was mildly elevated.  After 10 minutes went to next session and then struggling with words  Few minutes.  Felt off and next morning and had strange sensation.  22Q systolic.    Last year, she developed gradually worsening dyspnea, thought to be secondary to deconditioning, as cardiac workup including EKG and echocardiogram were unremarkable.  She is a therapist.  On 10/03/2019, she was on a virtual visit with a patient when the walls looked like they were moving back and forth.  It lasted about 5 to 10 minutes and resolved.  Her coworkers checked her blood pressure which was elevated at 333 systolic but her baseline blood pressure is low (often 54T to 62B systolic).  She started seeing her next patient when she suddenly had word-finding issues which lasted a few minutes.  She has history of migraines but no associated headache with this episode.  She didn't feel right for the rest of the day.  When she woke up the next morning, she still didn't feel back to baseline so she went to the ED where her blood pressure was 140/95.  EKG BP and labs normal.  She saw cardiology who suspected labile hypertension and was started on olmesartan.  Carotid duplex performed on 12/06/2019 showed no hemodynamically significant ICA stenosis and antegrade flow of both vertebral arteries.   For the past 3 to 4 months, she reports right-sided neck pain that sometimes radiates to the occipital region. She feels  her neck crack with movement.  She was recently in [place] where she had an X-ray performed which revealed cervical spondylosis.  Massage and switching pillow helped.   PAST MEDICAL HISTORY: Past Medical History:  Diagnosis Date  . Abnormal findings on diagnostic imaging of gall bladder    low EF  . Elevated IOP    watch for glaucoma  . Fibroid, uterine    seen on ct  . Normal cardiac stress test    1 2015    PAST SURGICAL HISTORY: No past surgical history on file.  MEDICATIONS: Current Outpatient Medications on File Prior to Visit  Medication Sig Dispense Refill  . cholecalciferol (VITAMIN D3) 25 MCG (1000 UNIT) tablet Take 3,000 Units by mouth once a week.     . olmesartan (BENICAR) 20 MG tablet Take 1 tablet (20 mg total) by mouth as directed. 1/2 tablet three times weekly 90 tablet 3   No current facility-administered medications on file prior to visit.    ALLERGIES: No Known Allergies  FAMILY HISTORY: Family History  Problem Relation Age of Onset  . Hypertension Mother   . Liver disease Father   . Healthy Daughter   . Healthy Daughter   . Breast cancer Other        GParent  . Heart attack Other        [employer]    SOCIAL HISTORY: Social History   Socioeconomic History  . Marital status: Married    Spouse name: Not on file  . Number of  children: 2  . Years of education: Not on file  . Highest education level: Not on file  Occupational History  . Not on file  Tobacco Use  . Smoking status: Never Smoker  . Smokeless tobacco: Never Used  Vaping Use  . Vaping Use: Never used  Substance and Sexual Activity  . Alcohol use: No  . Drug use: Never  . Sexual activity: Not on file  Other Topics Concern  . Not on file  Social History Narrative   H H  of  3-4 daughter college   Married  Masters education  homemaker   Husband works for [employer]  Smoke alarm seat belts no ets.    Moved from [place] P[place]   No pets.   Exercise  At least 3 x per  week.       Caffiene; diet coke .    Social Determinants of Health   Financial Resource Strain:   . Difficulty of Paying Living Expenses: Not on file  Food Insecurity:   . Worried About Charity fundraiser in the Last Year: Not on file  . Ran Out of Food in the Last Year: Not on file  Transportation Needs:   . Lack of Transportation (Medical): Not on file  . Lack of Transportation (Non-Medical): Not on file  Physical Activity:   . Days of Exercise per Week: Not on file  . Minutes of Exercise per Session: Not on file  Stress:   . Feeling of Stress : Not on file  Social Connections:   . Frequency of Communication with Friends and Family: Not on file  . Frequency of Social Gatherings with Friends and Family: Not on file  . Attends Religious Services: Not on file  . Active Member of Clubs or Organizations: Not on file  . Attends Archivist Meetings: Not on file  . Marital Status: Not on file  Intimate Partner Violence:   . Fear of Current or Ex-Partner: Not on file  . Emotionally Abused: Not on file  . Physically Abused: Not on file  . Sexually Abused: Not on file    Objective:  Blood pressure 130/81, pulse 95, height 5\' 3"  (1.6 m), weight 134 lb (60.8 kg), SpO2 99 %. General: No acute distress.  Patient appears well-groomed.   Head:  Normocephalic/atraumatic Eyes:  fundi examined but not visualized Neck: supple, mild right sided paraspinal tenderness, full range of motion Back: No paraspinal tenderness Heart: regular rate and rhythm Lungs: Clear to auscultation bilaterally. Vascular: No carotid bruits. Neurological Exam: Mental status: alert and oriented to person, place, and time, recent and remote memory intact, fund of knowledge intact, attention and concentration intact, speech fluent and not dysarthric, language intact. Cranial nerves: CN I: not tested CN II: pupils equal, round and reactive to light, visual fields intact CN III, IV, VI:  full range of  motion, no nystagmus, no ptosis CN V: facial sensation intact. CN VII: upper and lower face symmetric CN VIII: hearing intact CN IX, X: gag intact, uvula midline CN XI: sternocleidomastoid and trapezius muscles intact CN XII: tongue midline Bulk & Tone: normal, no fasciculations. Motor:  muscle strength 5/5 throughout Sensation:  Temperature and vibratory sensation intact. Deep Tendon Reflexes:  2+ throughout,  toes downgoing.   Finger to nose testing:  Without dysmetria.   Heel to shin:  Without dysmetria.   Gait:  Normal station and stride.  Romberg negative.  Assessment/Plan:   1.  Transient episode of visual and speech disturbance. Unclear etiology.  She has no history of stroke risk factors.  May have been related to elevated blood pressure (even though systolic was reportedly [id number]-912Q, it is quite elevated for her).  Also consider atypical migraine aura, although that would be diagnosis of exclusion 2.  Cervical spondylosis  1.  MRI of brain with and without contrast 2.  If neck continues to be a problem, PCP may consider referral to physical therapy 3.  Further recommendations pending results.  Thank you for allowing me to take part in the care of this patient.  Metta Clines, DO  CC: Shanon Ace, MD  05/18/2020 ADDENDUM: MRI of brain with and without contrast from 05/16/2020 personally reviewed and is normal.  No further recommendations.   Metta Clines, DO

## 2020-03-02 ENCOUNTER — Other Ambulatory Visit: Payer: Self-pay

## 2020-03-02 ENCOUNTER — Encounter: Payer: Self-pay | Admitting: Neurology

## 2020-03-02 ENCOUNTER — Ambulatory Visit (INDEPENDENT_AMBULATORY_CARE_PROVIDER_SITE_OTHER): Payer: BC Managed Care – PPO | Admitting: Neurology

## 2020-03-02 VITALS — BP 130/81 | HR 95 | Ht 63.0 in | Wt 134.0 lb

## 2020-03-02 DIAGNOSIS — H539 Unspecified visual disturbance: Secondary | ICD-10-CM | POA: Diagnosis not present

## 2020-03-02 DIAGNOSIS — M47812 Spondylosis without myelopathy or radiculopathy, cervical region: Secondary | ICD-10-CM

## 2020-03-02 DIAGNOSIS — R479 Unspecified speech disturbances: Secondary | ICD-10-CM | POA: Diagnosis not present

## 2020-03-02 NOTE — Patient Instructions (Addendum)
1.  Will check MRI of brain with and without contrast. We have sent a referral to [facility] for your MRI and they will call you directly to schedule your appointment. They are located at [address] If you need to contact them directly please call [phone].   2.  Further recommendations pending results. 

## 2020-03-09 ENCOUNTER — Ambulatory Visit: Payer: BC Managed Care – PPO | Admitting: Neurology

## 2020-03-25 ENCOUNTER — Other Ambulatory Visit: Payer: BC Managed Care – PPO

## 2020-03-26 ENCOUNTER — Other Ambulatory Visit: Payer: BC Managed Care – PPO

## 2020-04-27 DIAGNOSIS — Z6824 Body mass index (BMI) 24.0-24.9, adult: Secondary | ICD-10-CM | POA: Diagnosis not present

## 2020-04-27 DIAGNOSIS — Z01419 Encounter for gynecological examination (general) (routine) without abnormal findings: Secondary | ICD-10-CM | POA: Diagnosis not present

## 2020-04-27 DIAGNOSIS — Z1231 Encounter for screening mammogram for malignant neoplasm of breast: Secondary | ICD-10-CM | POA: Diagnosis not present

## 2020-04-27 DIAGNOSIS — Z124 Encounter for screening for malignant neoplasm of cervix: Secondary | ICD-10-CM | POA: Diagnosis not present

## 2020-04-27 LAB — HM MAMMOGRAPHY

## 2020-05-15 ENCOUNTER — Other Ambulatory Visit: Payer: Self-pay

## 2020-05-15 ENCOUNTER — Ambulatory Visit
Admission: RE | Admit: 2020-05-15 | Discharge: 2020-05-15 | Disposition: A | Discharge status: Home / Self Care | Payer: BC Managed Care – PPO | Source: Ambulatory Visit | Attending: Neurology | Admitting: Neurology

## 2020-05-15 DIAGNOSIS — H539 Unspecified visual disturbance: Secondary | ICD-10-CM | POA: Diagnosis not present

## 2020-05-15 DIAGNOSIS — R479 Unspecified speech disturbances: Secondary | ICD-10-CM

## 2020-05-15 DIAGNOSIS — R4789 Other speech disturbances: Secondary | ICD-10-CM | POA: Diagnosis not present

## 2020-05-15 MED ORDER — GADOBENATE DIMEGLUMINE 529 MG/ML IV SOLN
12.0000 mL | Freq: Once | INTRAVENOUS | Status: AC | PRN
  Administered 2020-05-15: 12 mL via INTRAVENOUS

## 2020-07-20 DIAGNOSIS — Z0000 Encounter for general adult medical examination without abnormal findings: Secondary | ICD-10-CM | POA: Diagnosis not present

## 2020-07-20 DIAGNOSIS — I10 Essential (primary) hypertension: Secondary | ICD-10-CM | POA: Diagnosis not present

## 2020-07-20 DIAGNOSIS — R82998 Other abnormal findings in urine: Secondary | ICD-10-CM | POA: Diagnosis not present

## 2020-07-20 DIAGNOSIS — Z1339 Encounter for screening examination for other mental health and behavioral disorders: Secondary | ICD-10-CM | POA: Diagnosis not present

## 2020-07-20 DIAGNOSIS — Z1331 Encounter for screening for depression: Secondary | ICD-10-CM | POA: Diagnosis not present

## 2020-07-20 DIAGNOSIS — Z23 Encounter for immunization: Secondary | ICD-10-CM | POA: Diagnosis not present

## 2020-07-22 DIAGNOSIS — M25562 Pain in left knee: Secondary | ICD-10-CM | POA: Diagnosis not present

## 2020-07-22 DIAGNOSIS — M25462 Effusion, left knee: Secondary | ICD-10-CM | POA: Diagnosis not present

## 2020-07-22 DIAGNOSIS — M23304 Other meniscus derangements, unspecified medial meniscus, left knee: Secondary | ICD-10-CM | POA: Diagnosis not present

## 2020-08-11 DIAGNOSIS — H25013 Cortical age-related cataract, bilateral: Secondary | ICD-10-CM | POA: Diagnosis not present

## 2020-08-11 DIAGNOSIS — H2513 Age-related nuclear cataract, bilateral: Secondary | ICD-10-CM | POA: Diagnosis not present

## 2020-08-11 DIAGNOSIS — H04123 Dry eye syndrome of bilateral lacrimal glands: Secondary | ICD-10-CM | POA: Diagnosis not present

## 2020-08-11 DIAGNOSIS — H40023 Open angle with borderline findings, high risk, bilateral: Secondary | ICD-10-CM | POA: Diagnosis not present

## 2020-08-11 DIAGNOSIS — H524 Presbyopia: Secondary | ICD-10-CM | POA: Diagnosis not present

## 2020-08-21 DIAGNOSIS — N950 Postmenopausal bleeding: Secondary | ICD-10-CM | POA: Diagnosis not present

## 2020-09-07 ENCOUNTER — Other Ambulatory Visit: Payer: Self-pay | Admitting: Sports Medicine

## 2020-09-07 ENCOUNTER — Ambulatory Visit
Admission: RE | Admit: 2020-09-07 | Discharge: 2020-09-07 | Disposition: A | Discharge status: Home / Self Care | Payer: BC Managed Care – PPO | Source: Ambulatory Visit | Attending: Sports Medicine | Admitting: Sports Medicine

## 2020-09-07 ENCOUNTER — Other Ambulatory Visit: Payer: Self-pay

## 2020-09-07 DIAGNOSIS — M542 Cervicalgia: Secondary | ICD-10-CM | POA: Diagnosis not present

## 2020-09-07 DIAGNOSIS — M25562 Pain in left knee: Secondary | ICD-10-CM

## 2020-09-07 DIAGNOSIS — M9908 Segmental and somatic dysfunction of rib cage: Secondary | ICD-10-CM | POA: Diagnosis not present

## 2020-09-07 DIAGNOSIS — M9901 Segmental and somatic dysfunction of cervical region: Secondary | ICD-10-CM | POA: Diagnosis not present

## 2020-09-07 DIAGNOSIS — M9902 Segmental and somatic dysfunction of thoracic region: Secondary | ICD-10-CM | POA: Diagnosis not present

## 2020-09-07 DIAGNOSIS — M23304 Other meniscus derangements, unspecified medial meniscus, left knee: Secondary | ICD-10-CM | POA: Diagnosis not present

## 2020-10-05 DIAGNOSIS — M25562 Pain in left knee: Secondary | ICD-10-CM | POA: Diagnosis not present

## 2020-10-05 DIAGNOSIS — M542 Cervicalgia: Secondary | ICD-10-CM | POA: Diagnosis not present

## 2020-10-05 DIAGNOSIS — M9902 Segmental and somatic dysfunction of thoracic region: Secondary | ICD-10-CM | POA: Diagnosis not present

## 2020-10-05 DIAGNOSIS — M9901 Segmental and somatic dysfunction of cervical region: Secondary | ICD-10-CM | POA: Diagnosis not present

## 2020-11-16 DIAGNOSIS — M542 Cervicalgia: Secondary | ICD-10-CM | POA: Diagnosis not present

## 2020-11-16 DIAGNOSIS — M25562 Pain in left knee: Secondary | ICD-10-CM | POA: Diagnosis not present

## 2020-11-16 DIAGNOSIS — M9902 Segmental and somatic dysfunction of thoracic region: Secondary | ICD-10-CM | POA: Diagnosis not present

## 2020-11-16 DIAGNOSIS — M9901 Segmental and somatic dysfunction of cervical region: Secondary | ICD-10-CM | POA: Diagnosis not present

## 2020-11-20 ENCOUNTER — Ambulatory Visit: Payer: BC Managed Care – PPO | Admitting: Cardiology

## 2020-11-20 ENCOUNTER — Other Ambulatory Visit: Payer: Self-pay

## 2020-11-20 ENCOUNTER — Encounter: Payer: Self-pay | Admitting: Cardiology

## 2020-11-20 VITALS — BP 107/70 | HR 69 | Temp 98.3°F | Resp 17 | Ht 63.0 in | Wt 133.2 lb

## 2020-11-20 DIAGNOSIS — G43409 Hemiplegic migraine, not intractable, without status migrainosus: Secondary | ICD-10-CM

## 2020-11-20 DIAGNOSIS — I10 Essential (primary) hypertension: Secondary | ICD-10-CM | POA: Diagnosis not present

## 2020-11-20 NOTE — Progress Notes (Signed)
Primary Physician/Referring:  Prince Solian, MD  Patient ID: Katie Lawson, female    DOB: 04-14-1965, 55 y.o.   MRN: [id number]  Chief Complaint  Patient presents with   labile hypertension   Shortness of Breath    1 year   HPI:    ANALYSSE QUINONEZ  is a 55 y.o. Asian [place] female with no significant medical history, has maternal uncles with premature coronary artery disease but her parents or her siblings do not have any significant cardiac history.  I seen her in February 2021 for gradually worsening dyspnea, echocardiogram was normal and chest x-ray was normal, coronary calcium score was 0.  She also has mild hypertension and has been on Benicar HCT.  She now presents for an annual office visit.  Except for episodes of migraines, she remains asymptomatic and still has occasional episodes of dizzy spells which awake but very short lasting and unrelated to her position of her body.  She has not had any syncope, denies any other symptoms.  Past Medical History:  Diagnosis Date   Abnormal findings on diagnostic imaging of gall bladder    low EF   Elevated IOP    watch for glaucoma   Fibroid, uterine    seen on ct   Normal cardiac stress test    1 2015   History reviewed. No pertinent surgical history. Family History  Problem Relation Age of Onset   Hypertension Mother    Liver disease Father    Healthy Daughter    Healthy Daughter    Breast cancer Other        GParent   Heart attack Other        [facility] [employer]    Social History   Tobacco Use   Smoking status: Never   Smokeless tobacco: Never  Substance Use Topics   Alcohol use: No   ROS  Review of Systems  Cardiovascular:  Negative for dyspnea on exertion and leg swelling.  Gastrointestinal:  Negative for melena.  Objective  Blood pressure 107/70, pulse 69, temperature 98.3 F (36.8 C), temperature source Temporal, resp. rate 17, height _0  (1.6 m), weight 133 lb 3.2 oz (60.4 kg), SpO2 98 %.   Vitals with BMI 11/20/2020 03/02/2020 11/18/2019  Height _1  _2  _3   Weight 133 lbs 3 oz 134 lbs 132 lbs  BMI 23.6 70.35 00.93  Systolic 818 299 371  Diastolic 70 81 83  Pulse 69 95 58     Physical Exam Cardiovascular:     Rate and Rhythm: Normal rate and regular rhythm.     Pulses: Intact distal pulses.     Heart sounds: Normal heart sounds. No murmur heard.   No gallop.     Comments: No leg edema, no JVD. Pulmonary:     Effort: Pulmonary effort is normal.     Breath sounds: Normal breath sounds.  Abdominal:     General: Bowel sounds are normal.     Palpations: Abdomen is soft.  Skin:    General: Skin is warm and dry.   Laboratory examination:   No results for input(s): NA, K, CL, CO2, GLUCOSE, BUN, CREATININE, CALCIUM, GFRNONAA, GFRAA in the last 8760 hours.  CrCl cannot be calculated (Patient's most recent lab result is older than the maximum 21 days allowed.).  CMP Latest Ref Rng & Units 10/24/2019 04/26/2019 04/23/2018  Glucose 70 - 99 mg/dL 99 84 74  BUN 6 - 20 mg/dL _4 Creatinine  0.44 - 1.00 mg/dL 0.70 0.54 0.64  Sodium 135 - 145 mmol/L 141 137 137  Potassium 3.5 - 5.1 mmol/L 4.1 4.5 3.9  Chloride 98 - 111 mmol/L 103 102 97  CO2 22 - 32 mmol/L _0 Calcium 8.9 - 10.3 mg/dL 9.6 9.4 10.1  Total Protein 6.0 - 8.3 g/dL - 6.7 7.3  Total Bilirubin 0.2 - 1.2 mg/dL - 0.4 0.7  Alkaline Phos 39 - 117 U/L - 64 56  AST 0 - 37 U/L - 14 17  ALT 0 - 35 U/L - 11 16   CBC Latest Ref Rng & Units 10/24/2019 04/26/2019 04/23/2018  WBC 4.0 - 10.5 K/uL 6.7 5.8 6.4  Hemoglobin 12.0 - 15.0 g/dL 12.7 12.2 12.3  Hematocrit 36.0 - 46.0 % 37.6 35.7(L) 36.2  Platelets 150 - 400 K/uL 262 269.0 267.0   Lipid Panel     Component Value Date/Time   CHOL 181 04/26/2019 1134   TRIG 138.0 04/26/2019 1134   HDL 48.20 04/26/2019 1134   CHOLHDL 4 04/26/2019 1134   VLDL 27.6 04/26/2019 1134   LDLCALC 105 (H) 04/26/2019 1134       NHDL                                                              133  HEMOGLOBIN A1C No results found for: HGBA1C, MPG TSH No results for input(s): TSH in the last 8760 hours.  Medications and allergies  No Known Allergies   Current Outpatient Medications  Medication Instructions   cholecalciferol (VITAMIN D3) 3,000 Units, Oral, Weekly   olmesartan (BENICAR) 20 mg, Oral, As directed, 1/2 tablet three times weekly   Radiology:   No results found.  Chest X-Ray 06/24/2019:  The heart size and mediastinal contours are within normal limits. Both lungs are clear. The visualized skeletal structures are unremarkable IMPRESSION: Normal exam.  Cardiac Studies:   Echocardiogram 06/05/2019:  Left ventricle cavity is normal in size and wall thickness. Normal global wall motion. Normal LV systolic function with EF 55%. Normal diastolic filling pattern.  Mild tricuspid regurgitation.  No evidence of pulmonary hypertension.  Cardiac calcium scoring 06/19/2013: Calcium score of 0.  Treadmill exercise stress 04/17/2012: Indications: Assessment of Chest Pain. Symptoms: MPHR met. Fatigue. Arrhythmia: None The patient exercised according to the Bruce   protocol, Total time recorded 7  Min. 31 sec. achieving a max heart rate of 178  which was 102% of MPHR for age and  8.7 METS of work. Baseline NIBP was 118/78. Peak NIBP was 140/60 MaxSysp was: 140 MaxDiasp was: 80. The baseline ECG showed NSR,Non specific T change. During exercise there was No ST-T chnages of ischemia. Conclusions: Negative for ischemia. Normal test.  Continue primary prevention. Evaluate for non cardiac chest pain if persists.  EKG  EKG 11/18/2019: Normal sinus rhythm with rate of 76 bpm, normal axis.  Nonspecific T abnormality.  No evidence of ischemia.    EKG 05/20/2019: Normal sinus rhythm with rate of 68 bpm, normal axis, no evidence of ischemia, normal EKG.     Assessment     ICD-10-CM   1. Primary hypertension  I10 EKG 12-Lead    2. Hemiplegic migraine without status  migrainosus, not intractable  G43.409      No orders  of the defined types were placed in this encounter.   Medications Discontinued During This Encounter  Medication Reason   Cholecalciferol (VITAMIN D3) 25 MCG (1194 UT) CAPS Duplicate     Recommendations:   Katie Lawson  is a 55 y.o. Asian [place] female with no significant medical history, has maternal uncles with premature coronary artery disease but her parents or her siblings do not have any significant cardiac history.  I seen her in February 2021 for gradually worsening dyspnea, echocardiogram was normal and chest x-ray was normal, coronary calcium score was 0.  She also has mild hypertension and has been on Benicar HCT.  She now presents for an annual office visit.  She is presently asymptomatic and tolerating all her medications well.  She does have migraine headaches, prevention strategy using Excedrin Migraine and laying down to rest in a dark room at the onset of aura discussed with the patient.  Otherwise stable from cardiac standpoint, no changes in the medications were done today.  I will see her back on a as needed basis.    Adrian Prows, MD, [facility] 11/20/2020, 1:42 PM Office: [phone]

## 2020-12-25 DIAGNOSIS — N950 Postmenopausal bleeding: Secondary | ICD-10-CM | POA: Diagnosis not present

## 2021-01-05 DIAGNOSIS — R140 Abdominal distension (gaseous): Secondary | ICD-10-CM | POA: Diagnosis not present

## 2021-01-05 DIAGNOSIS — K828 Other specified diseases of gallbladder: Secondary | ICD-10-CM | POA: Diagnosis not present

## 2021-01-05 DIAGNOSIS — Z86010 Personal history of colonic polyps: Secondary | ICD-10-CM | POA: Diagnosis not present

## 2021-01-05 DIAGNOSIS — Z1211 Encounter for screening for malignant neoplasm of colon: Secondary | ICD-10-CM | POA: Diagnosis not present

## 2021-01-25 DIAGNOSIS — I10 Essential (primary) hypertension: Secondary | ICD-10-CM | POA: Diagnosis not present

## 2021-03-26 DIAGNOSIS — Z1211 Encounter for screening for malignant neoplasm of colon: Secondary | ICD-10-CM | POA: Diagnosis not present

## 2021-07-30 DIAGNOSIS — I10 Essential (primary) hypertension: Secondary | ICD-10-CM | POA: Diagnosis not present

## 2021-07-30 DIAGNOSIS — E559 Vitamin D deficiency, unspecified: Secondary | ICD-10-CM | POA: Diagnosis not present

## 2021-08-09 DIAGNOSIS — Z0000 Encounter for general adult medical examination without abnormal findings: Secondary | ICD-10-CM | POA: Diagnosis not present

## 2021-08-09 DIAGNOSIS — I10 Essential (primary) hypertension: Secondary | ICD-10-CM | POA: Diagnosis not present

## 2021-08-09 DIAGNOSIS — R82998 Other abnormal findings in urine: Secondary | ICD-10-CM | POA: Diagnosis not present

## 2021-10-11 DIAGNOSIS — Z780 Asymptomatic menopausal state: Secondary | ICD-10-CM | POA: Diagnosis not present

## 2021-12-13 DIAGNOSIS — Z800 Family history of malignant neoplasm of digestive organs: Secondary | ICD-10-CM | POA: Diagnosis not present

## 2021-12-13 DIAGNOSIS — Z6824 Body mass index (BMI) 24.0-24.9, adult: Secondary | ICD-10-CM | POA: Diagnosis not present

## 2021-12-13 DIAGNOSIS — Z01419 Encounter for gynecological examination (general) (routine) without abnormal findings: Secondary | ICD-10-CM | POA: Diagnosis not present

## 2021-12-13 DIAGNOSIS — Z1231 Encounter for screening mammogram for malignant neoplasm of breast: Secondary | ICD-10-CM | POA: Diagnosis not present

## 2022-01-31 DIAGNOSIS — H40023 Open angle with borderline findings, high risk, bilateral: Secondary | ICD-10-CM | POA: Diagnosis not present

## 2022-01-31 DIAGNOSIS — H524 Presbyopia: Secondary | ICD-10-CM | POA: Diagnosis not present

## 2022-01-31 DIAGNOSIS — H25813 Combined forms of age-related cataract, bilateral: Secondary | ICD-10-CM | POA: Diagnosis not present

## 2022-01-31 DIAGNOSIS — H04123 Dry eye syndrome of bilateral lacrimal glands: Secondary | ICD-10-CM | POA: Diagnosis not present

## 2022-06-12 IMAGING — CT CT CARDIAC CORONARY ARTERY CALCIUM SCORE
3 series · 13 of 20 positions shown, 15 images · non-contrast
Comparison: 06/24/2019 chest radiograph.

CLINICAL DATA: Borderline cholesterol. Asymptomatic. Family history
of hyperlipidemia.

EXAM:
CT CARDIAC CORONARY ARTERY CALCIUM SCORE
TECHNIQUE: Non-contrast imaging through the heart was performed using
prospective ECG gating. Image post processing was performed on an
independent workstation, allowing for quantitative analysis of the
heart and coronary arteries. Note that this exam targets the heart
and the chest was not imaged in its entirety.

[Series 2: calcium scoring 2.00 qr36 bestdiast 70% hrt calciu · axial · 0.29mm/px · z∈[+1651,+1707]mm · 3 of 70 slices shown]
[im 14/70  vessel]
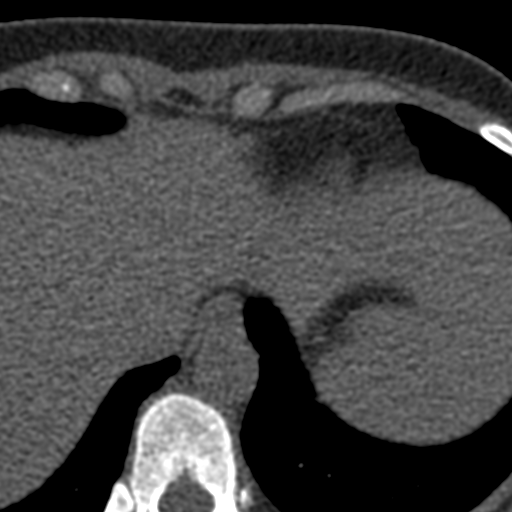
[im 28/70  vessel]
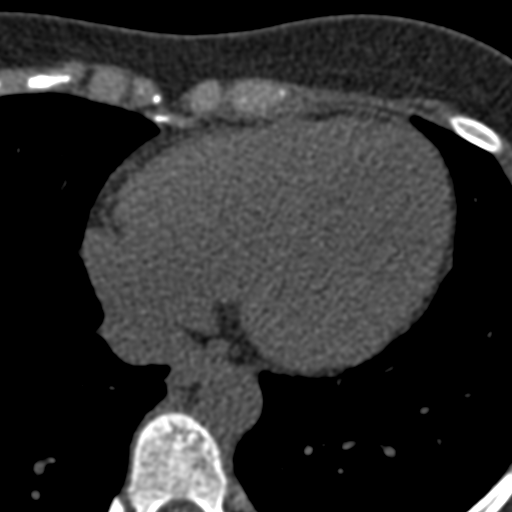
[im 42/70  vessel]
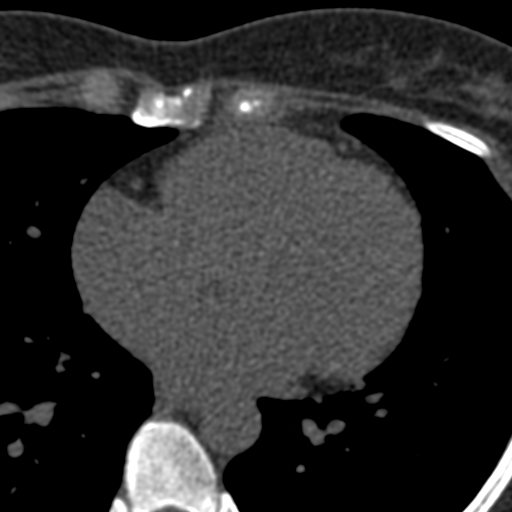

[Series 3: calcium scoring 2.00 br40 bestdiast 70% axial · axial · 0.48mm/px · z∈[+1647,+1739]mm · 5 of 70 slices shown, 7 images]
[im 12/70  vessel]
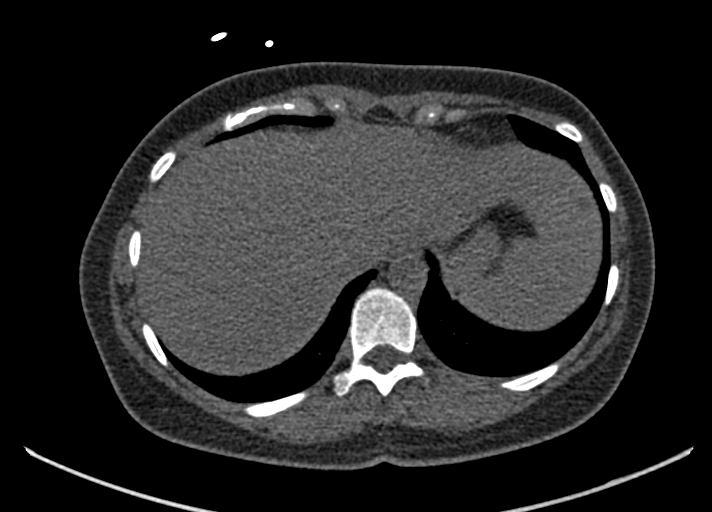
[im 12/70  lung]
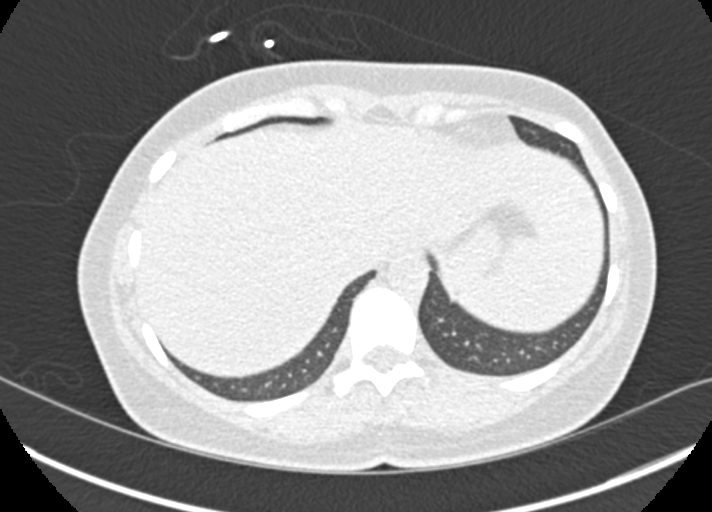
[im 24/70  vessel]
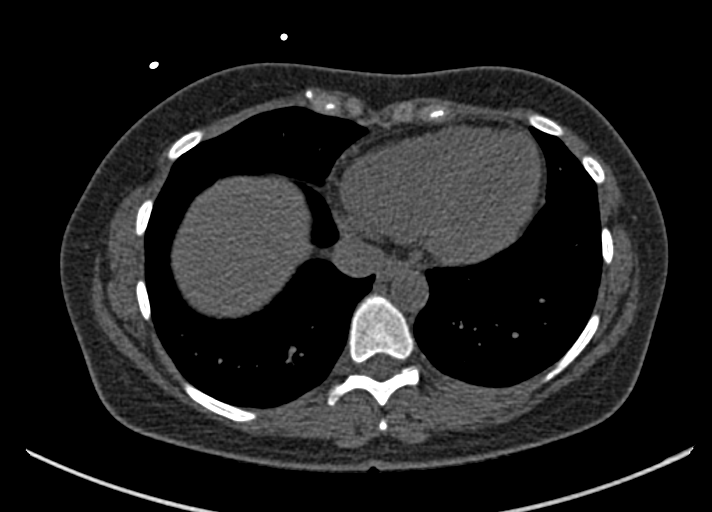
[im 35/70  vessel]
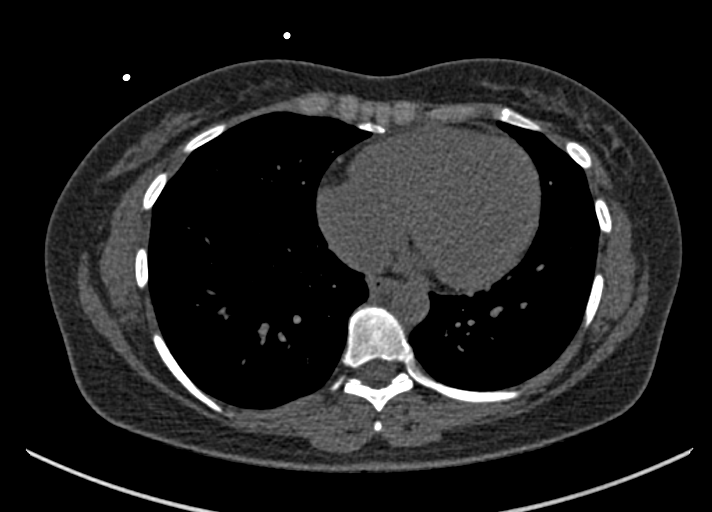
[im 47/70  vessel]
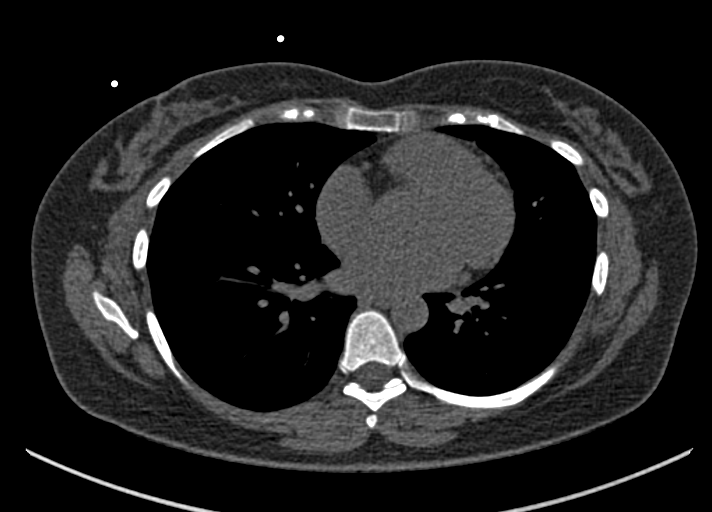
[im 58/70  vessel]
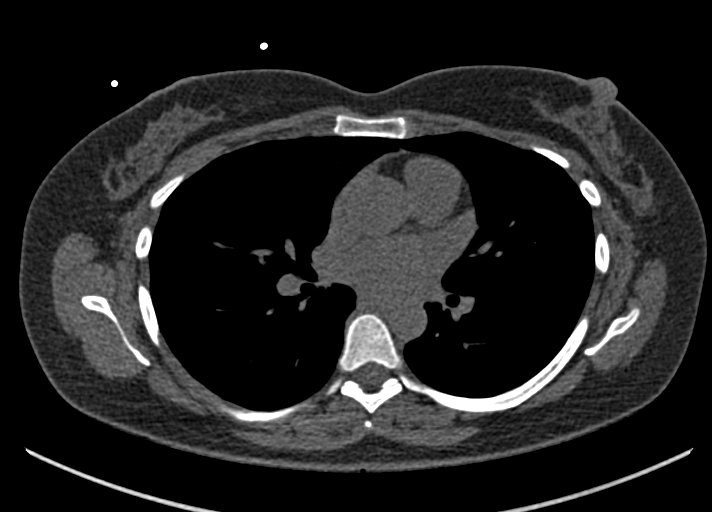
[im 58/70  lung]
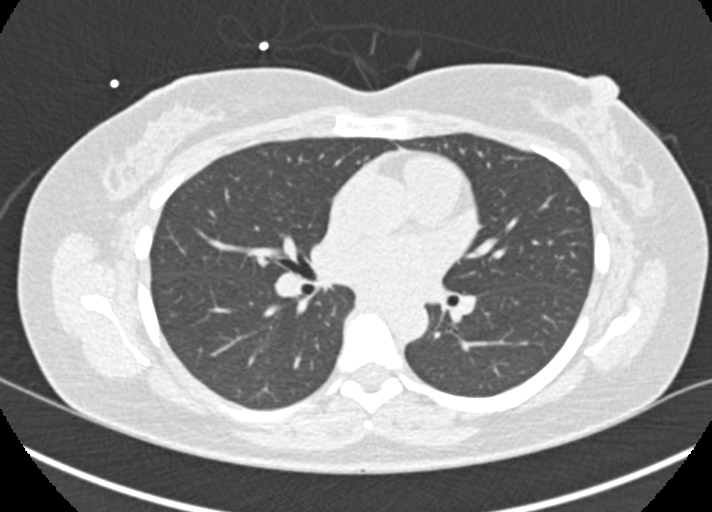

[Series 9: calcium scoring 2.00 br60 bestdiast 70% lungs · axial · 0.48mm/px · z∈[+1647,+1739]mm · 5 of 70 slices shown]
[im 12/70  vessel]
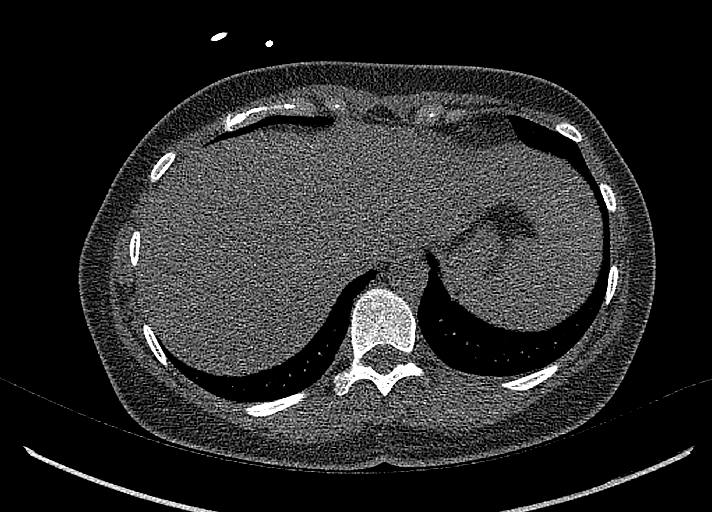
[im 24/70  vessel]
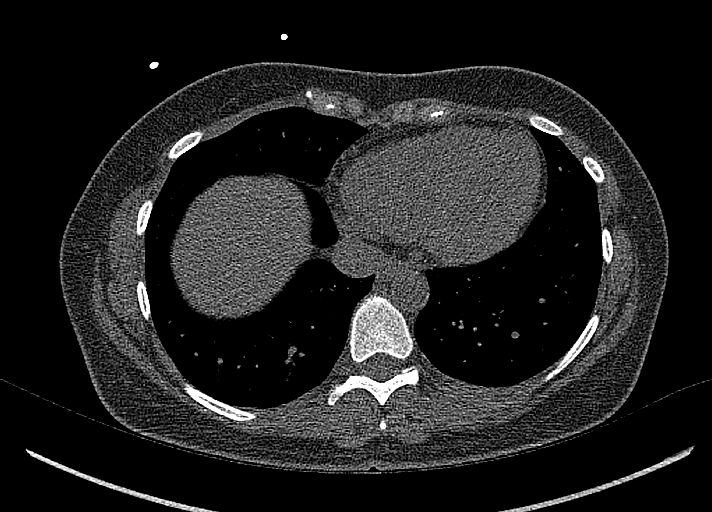
[im 35/70  vessel]
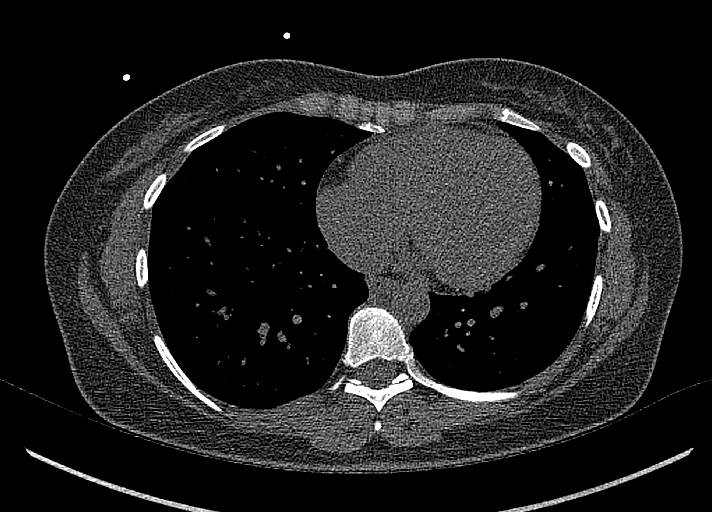
[im 47/70  vessel]
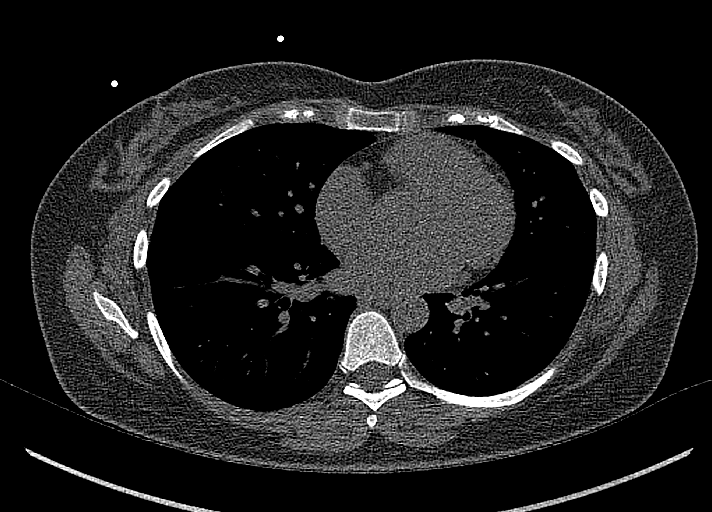
[im 58/70  vessel]
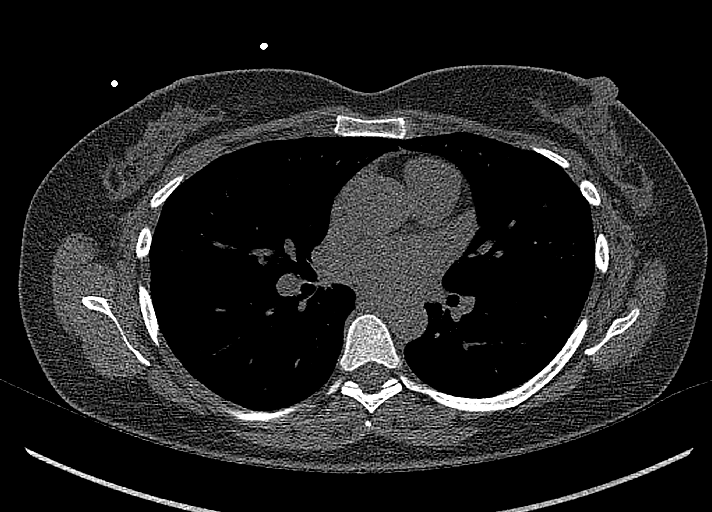

[13 of 20 positions shown; findings below may reference images not displayed]

FINDINGS: CORONARY CALCIUM SCORES:

Total Agatston Score: 0- No coronary calcium identified.

AORTA MEASUREMENTS:

Ascending Aorta: 29 mm

Descending Aorta: 15 mm

OTHER FINDINGS:

Cardiovascular: Normal aortic caliber. Normal heart size, without
pericardial effusion.

Mediastinum/Nodes: No imaged thoracic adenopathy.

Lungs/Pleura: No pleural fluid.  Clear imaged lungs.

Upper Abdomen: Mild hepatic steatosis. Normal imaged portions of the
spleen, stomach.

Musculoskeletal: No acute osseous abnormality.
IMPRESSION: 1. No coronary calcium identified.
2. No acute process in the imaged chest.
3. Mild hepatic steatosis.

## 2022-07-04 DIAGNOSIS — M25511 Pain in right shoulder: Secondary | ICD-10-CM | POA: Diagnosis not present

## 2022-07-04 DIAGNOSIS — M9907 Segmental and somatic dysfunction of upper extremity: Secondary | ICD-10-CM | POA: Diagnosis not present

## 2022-07-04 DIAGNOSIS — M9908 Segmental and somatic dysfunction of rib cage: Secondary | ICD-10-CM | POA: Diagnosis not present

## 2022-07-04 DIAGNOSIS — M9902 Segmental and somatic dysfunction of thoracic region: Secondary | ICD-10-CM | POA: Diagnosis not present

## 2022-07-08 DIAGNOSIS — R2230 Localized swelling, mass and lump, unspecified upper limb: Secondary | ICD-10-CM | POA: Diagnosis not present

## 2022-08-29 DIAGNOSIS — R7989 Other specified abnormal findings of blood chemistry: Secondary | ICD-10-CM | POA: Diagnosis not present

## 2022-08-29 DIAGNOSIS — E559 Vitamin D deficiency, unspecified: Secondary | ICD-10-CM | POA: Diagnosis not present

## 2022-08-29 DIAGNOSIS — I10 Essential (primary) hypertension: Secondary | ICD-10-CM | POA: Diagnosis not present

## 2022-08-29 DIAGNOSIS — E785 Hyperlipidemia, unspecified: Secondary | ICD-10-CM | POA: Diagnosis not present

## 2022-09-05 DIAGNOSIS — Z0000 Encounter for general adult medical examination without abnormal findings: Secondary | ICD-10-CM | POA: Diagnosis not present

## 2022-09-05 DIAGNOSIS — R82998 Other abnormal findings in urine: Secondary | ICD-10-CM | POA: Diagnosis not present

## 2022-09-05 DIAGNOSIS — Z1339 Encounter for screening examination for other mental health and behavioral disorders: Secondary | ICD-10-CM | POA: Diagnosis not present

## 2022-09-05 DIAGNOSIS — Z1331 Encounter for screening for depression: Secondary | ICD-10-CM | POA: Diagnosis not present

## 2022-09-05 DIAGNOSIS — I10 Essential (primary) hypertension: Secondary | ICD-10-CM | POA: Diagnosis not present

## 2022-09-20 ENCOUNTER — Other Ambulatory Visit: Payer: Self-pay | Admitting: Internal Medicine

## 2022-09-20 DIAGNOSIS — E785 Hyperlipidemia, unspecified: Secondary | ICD-10-CM

## 2022-10-14 ENCOUNTER — Ambulatory Visit
Admission: RE | Admit: 2022-10-14 | Discharge: 2022-10-14 | Disposition: A | Discharge status: Home / Self Care | Payer: No Typology Code available for payment source | Source: Ambulatory Visit | Attending: Internal Medicine | Admitting: Internal Medicine

## 2022-10-14 DIAGNOSIS — E785 Hyperlipidemia, unspecified: Secondary | ICD-10-CM

## 2022-11-09 ENCOUNTER — Encounter: Payer: Self-pay | Admitting: Cardiology

## 2022-11-09 ENCOUNTER — Ambulatory Visit: Payer: BC Managed Care – PPO | Admitting: Cardiology

## 2022-11-09 VITALS — BP 121/70 | HR 82 | Resp 16 | Ht 63.0 in | Wt 132.8 lb

## 2022-11-09 DIAGNOSIS — R0609 Other forms of dyspnea: Secondary | ICD-10-CM | POA: Diagnosis not present

## 2022-11-09 DIAGNOSIS — E7800 Pure hypercholesterolemia, unspecified: Secondary | ICD-10-CM

## 2022-11-09 DIAGNOSIS — I700 Atherosclerosis of aorta: Secondary | ICD-10-CM

## 2022-11-09 DIAGNOSIS — I10 Essential (primary) hypertension: Secondary | ICD-10-CM | POA: Diagnosis not present

## 2022-11-09 MED ORDER — SIMVASTATIN 40 MG PO TABS: 40.0000 mg | ORAL_TABLET | Freq: Every day | ORAL | 3 refills | Status: AC

## 2022-11-09 NOTE — Progress Notes (Signed)
Primary Physician/Referring:  Chilton Greathouse, MD  Patient ID: Katie Lawson, female    DOB: 11/09/1965, 57 y.o.   MRN: [id number]  Chief Complaint  Patient presents with   Results   HPI:    Katie Lawson  is a 57 y.o. Asian [place] female patient with primary hypertension, hypercholesterolemia, migraine headaches referred back to me for evaluation of aortic atherosclerosis noted on recent coronary calcium score testing on 10/14/2022. Patient also has noticed occasional episodes of dyspnea that is unexplained.  She had gone with her family for a hike and they had to return home as she got markedly dyspneic and heart rate was very high.  On the other hand she has been exercising regularly with no chest pain or dyspnea as well.  Occasionally she has noticed low blood pressure and fatigue after discontinuing Benicar due to low blood pressure, she noticed a recurrence of migraine headaches since she has been on it.  No PND orthopnea, no dizziness or syncope, otherwise remains active.  Past Medical History:  Diagnosis Date   Abnormal findings on diagnostic imaging of gall bladder    low EF   Elevated IOP    watch for glaucoma   Fibroid, uterine    seen on ct   Normal cardiac stress test    1 2015   History reviewed. No pertinent surgical history. Family History  Problem Relation Age of Onset   Hypertension Mother    Liver disease Father    Healthy Daughter    Healthy Daughter    Breast cancer Other        GParent   Heart attack Other        [facility] [employer]    Social History   Tobacco Use   Smoking status: Never   Smokeless tobacco: Never  Substance Use Topics   Alcohol use: No   Marital Status: Married  ROS  Review of Systems  Cardiovascular:  Positive for dyspnea on exertion. Negative for chest pain and leg swelling.   Objective      11/09/2022    9:09 AM 11/20/2020    1:15 PM 03/02/2020   11:02 AM  Vitals with BMI  Height 5\' 3"  5\' 3"  5\' 3"    Weight 132 lbs 13 oz 133 lbs 3 oz 134 lbs  BMI 23.53 23.6 23.74  Systolic 121 107 696  Diastolic 70 70 81  Pulse 82 69 95   Blood pressure 121/70, pulse 82, resp. rate 16, height 5\' 3"  (1.6 m), weight 132 lb 12.8 oz (60.2 kg), SpO2 99%.   Physical Exam Neck:     Vascular: No carotid bruit or JVD.  Cardiovascular:     Rate and Rhythm: Normal rate and regular rhythm.     Pulses: Intact distal pulses.     Heart sounds: Normal heart sounds. No murmur heard.    No gallop.  Pulmonary:     Effort: Pulmonary effort is normal.     Breath sounds: Normal breath sounds.  Abdominal:     General: Bowel sounds are normal.     Palpations: Abdomen is soft.  Musculoskeletal:     Right lower leg: No edema.     Left lower leg: No edema.    Laboratory examination:   External labs:   Labs 08/29/2022:  Albumin to creatinine urinary ratio 13.  Serum glucose 92 mg, BUN 14, creatinine 0.7, EGFR 86 mL, potassium 4.2, LFTs normal.  Hb 12.0/HCT 36.7, platelets 244.  Normal indicis.  Total  cholesterol 216, triglycerides 149, HDL 40, LDL 146.  Non-HDL cholesterol 176.  TSH normal at 1.71.  Vitamin D 26.5.  Radiology:    Cardiac Studies:   Treadmill exercise stress 04/17/2012: Indications: Assessment of Chest Pain. Symptoms: MPHR met. Fatigue. Arrhythmia: None The patient exercised according to the Bruce   protocol, Total time recorded 7  Min. 31 sec. achieving a max heart rate of 178  which was 102% of MPHR for age and  8.7 METS of work. Baseline NIBP was 118/78. Peak NIBP was 140/60 MaxSysp was: 140 MaxDiasp was: 80. The baseline ECG showed NSR,Non specific T change. During exercise there was No ST-T chnages of ischemia. Conclusions: Negative for ischemia. Normal test.  Continue primary prevention. Evaluate for non cardiac chest pain if persists  Echocardiogram 06/05/2019:  Left ventricle cavity is normal in size and wall thickness. Normal global wall motion. Normal LV systolic function with EF  55%. Normal diastolic filling pattern.  Mild tricuspid regurgitation.  No evidence of pulmonary hypertension.  Carotid artery duplex  12/06/2019: No hemodynamically significant arterial disease in the right internal carotid artery. No hemodynamically significant arterial disease in the left internal carotid artery. Minimal diffuse plaque noted.  There is slight increase in the left distal ICA velocity probably due to vessel tortuosity.  Antegrade right vertebral artery flow. Antegrade left vertebral artery flow  Coronary calcium score 10/14/2022 and comparison to 12/30/2019: Total Agatston coronary calcium score 0. MESA database percentile 0. Visualized ascending and descending aorta Normal in size, mild aortic arch atherosclerosis noted.  New compared to 12/30/2019. Extracardiac abnormalities: Mild hepatic steatosis.   EKG:   EKG 11/09/2022: Normal sinus rhythm with rate of 67 bpm, normal axis, normal EKG.  Baseline wander.  Compared to 11/20/2020, no significant change.   Medications and allergies  No Known Allergies  Medication list   Current Outpatient Medications:    cholecalciferol (VITAMIN D3) 25 MCG (1000 UNIT) tablet, Take 3,000 Units by mouth once a week. , Disp: , Rfl:    olmesartan (BENICAR) 20 MG tablet, Take 1 tablet (20 mg total) by mouth as directed. 1/2 tablet three times weekly, Disp: 90 tablet, Rfl: 3   simvastatin (ZOCOR) 40 MG tablet, Take 1 tablet (40 mg total) by mouth at bedtime., Disp: 30 tablet, Rfl: 3  Assessment     ICD-10-CM   1. Primary hypertension  I10 EKG 12-Lead    PCV CARDIAC STRESS TEST    2. Hypercholesteremia  E78.00 simvastatin (ZOCOR) 40 MG tablet    3. Aortic atherosclerosis (HCC)  I70.0     4. Dyspnea on exertion  R06.09 PCV CARDIAC STRESS TEST       Orders Placed This Encounter  Procedures   PCV CARDIAC STRESS TEST    Standing Status:   Future    Standing Expiration Date:   01/09/2023   EKG 12-Lead    Meds ordered this encounter   Medications   simvastatin (ZOCOR) 40 MG tablet    Sig: Take 1 tablet (40 mg total) by mouth at bedtime.    Dispense:  30 tablet    Refill:  3    There are no discontinued medications.   Recommendations:   Katie Lawson is a 57 y.o. Asian [place] female patient with primary hypertension, hypercholesterolemia, migraine headaches referred back to me for evaluation of aortic atherosclerosis noted on recent coronary calcium score testing on 10/14/2022.  1. Primary hypertension Patient's blood pressure is well-controlled on Benicar, continue the same.  Minimal nonspecific EKG  changes are noted in the inferior however she has had this in the past. - EKG 12-Lead - PCV CARDIAC STRESS TEST; Future  2. Hypercholesteremia Her LDL has risen over time, in view of episodes of dyspnea, although coronary calcium score is 0 aortic atherosclerosis noted on the CT scan and soft plaque in the coronary arteries cannot be completely excluded especially in Asian [place] female.  Endothelial dysfunction or dyspnea that she is complaining about best option is to start with primary prevention simvastatin 40 mg ordered.  She has an appointment to see Dr. Felipa Eth in 6 weeks at which time repeat can be rechecked, goal LDL <100 if possible closer to 70 as long as she tolerates statins with no side effects.  - simvastatin (ZOCOR) 40 MG tablet; Take 1 tablet (40 mg total) by mouth at bedtime.  Dispense: 30 tablet; Refill: 3  3. Aortic atherosclerosis (HCC) As discussed above, in view of aortic atherosclerosis, statins have not started.  4. Dyspnea on exertion She has had occasional episodes of exertional dyspnea that has been lifestyle limiting but no PND orthopnea or no clinical evidence of heart failure.  Doubt that this is anginal equivalent, her dyspnea could be related to endothelial dysfunction episodes and/or hypertension episodes.  Will schedule her for a routine treadmill exercise stress test.  I do not  think she needs an echocardiogram in view of normal physical exam and a normal EKG.  Unless the stress test is abnormal I will see him back on a as needed basis.  Patient is aware that she can easily contact me if needed. - PCV CARDIAC STRESS TEST; Future    Yates Decamp, MD, [facility] 11/09/2022, 9:41 AM Office: [phone]

## 2022-11-11 ENCOUNTER — Ambulatory Visit: Payer: BC Managed Care – PPO

## 2022-11-11 DIAGNOSIS — R0609 Other forms of dyspnea: Secondary | ICD-10-CM | POA: Diagnosis not present

## 2022-11-11 DIAGNOSIS — I10 Essential (primary) hypertension: Secondary | ICD-10-CM

## 2022-11-16 ENCOUNTER — Encounter: Payer: Self-pay | Admitting: Cardiology

## 2022-11-16 DIAGNOSIS — E7800 Pure hypercholesterolemia, unspecified: Secondary | ICD-10-CM

## 2022-11-16 NOTE — Progress Notes (Signed)
Treadmill Exercise Stress 11/11/2022: The patient exercised for 8 minutes and 23 seconds on Bruce protocol; achieved 10.16 METs at 96% MPHR. Chest pain is not present. Stress EKG is negative for ischemia. Resting EKG non specific T change, stress EKG pseudo normalization of T wave inversion. No arrhythmias noted on ECG at stress. The heart rate response was normal. The blood pressure response was normal

## 2022-11-17 ENCOUNTER — Other Ambulatory Visit: Payer: Self-pay | Admitting: Cardiology

## 2022-11-17 DIAGNOSIS — E7800 Pure hypercholesterolemia, unspecified: Secondary | ICD-10-CM

## 2022-11-17 NOTE — Telephone Encounter (Signed)
ICD-10-CM   1. Hypercholesteremia  E78.00 Lipid Panel With LDL/HDL Ratio     Orders Placed This Encounter  Procedures   Lipid Panel With LDL/HDL Ratio    Standing Status:   Future    Standing Expiration Date:   11/17/2023

## 2022-11-17 NOTE — Telephone Encounter (Signed)
From patient.

## 2023-01-01 ENCOUNTER — Encounter: Payer: Self-pay | Admitting: Cardiology

## 2023-01-06 DIAGNOSIS — Z1389 Encounter for screening for other disorder: Secondary | ICD-10-CM | POA: Diagnosis not present

## 2023-01-06 DIAGNOSIS — Z0000 Encounter for general adult medical examination without abnormal findings: Secondary | ICD-10-CM | POA: Diagnosis not present

## 2023-01-13 DIAGNOSIS — I10 Essential (primary) hypertension: Secondary | ICD-10-CM | POA: Diagnosis not present

## 2023-03-13 DIAGNOSIS — Z1331 Encounter for screening for depression: Secondary | ICD-10-CM | POA: Diagnosis not present

## 2023-03-13 DIAGNOSIS — Z01419 Encounter for gynecological examination (general) (routine) without abnormal findings: Secondary | ICD-10-CM | POA: Diagnosis not present

## 2023-03-13 DIAGNOSIS — Z1231 Encounter for screening mammogram for malignant neoplasm of breast: Secondary | ICD-10-CM | POA: Diagnosis not present

## 2023-05-26 DIAGNOSIS — H40023 Open angle with borderline findings, high risk, bilateral: Secondary | ICD-10-CM | POA: Diagnosis not present

## 2023-05-26 DIAGNOSIS — H04123 Dry eye syndrome of bilateral lacrimal glands: Secondary | ICD-10-CM | POA: Diagnosis not present

## 2023-05-26 DIAGNOSIS — H25813 Combined forms of age-related cataract, bilateral: Secondary | ICD-10-CM | POA: Diagnosis not present

## 2023-09-01 DIAGNOSIS — I10 Essential (primary) hypertension: Secondary | ICD-10-CM | POA: Diagnosis not present

## 2023-09-01 DIAGNOSIS — Z0189 Encounter for other specified special examinations: Secondary | ICD-10-CM | POA: Diagnosis not present

## 2023-09-01 DIAGNOSIS — E559 Vitamin D deficiency, unspecified: Secondary | ICD-10-CM | POA: Diagnosis not present

## 2023-09-08 DIAGNOSIS — Z1331 Encounter for screening for depression: Secondary | ICD-10-CM | POA: Diagnosis not present

## 2023-09-08 DIAGNOSIS — Z0000 Encounter for general adult medical examination without abnormal findings: Secondary | ICD-10-CM | POA: Diagnosis not present

## 2023-09-08 DIAGNOSIS — Z1339 Encounter for screening examination for other mental health and behavioral disorders: Secondary | ICD-10-CM | POA: Diagnosis not present

## 2023-09-08 DIAGNOSIS — M2550 Pain in unspecified joint: Secondary | ICD-10-CM | POA: Diagnosis not present

## 2023-09-08 DIAGNOSIS — R82998 Other abnormal findings in urine: Secondary | ICD-10-CM | POA: Diagnosis not present

## 2023-09-08 DIAGNOSIS — I10 Essential (primary) hypertension: Secondary | ICD-10-CM | POA: Diagnosis not present

## 2023-11-25 ENCOUNTER — Other Ambulatory Visit: Payer: Self-pay

## 2023-11-25 ENCOUNTER — Ambulatory Visit (HOSPITAL_BASED_OUTPATIENT_CLINIC_OR_DEPARTMENT_OTHER): Attending: Internal Medicine | Admitting: Rehabilitative and Restorative Service Providers"

## 2023-11-25 ENCOUNTER — Encounter (HOSPITAL_BASED_OUTPATIENT_CLINIC_OR_DEPARTMENT_OTHER): Payer: Self-pay | Admitting: Rehabilitative and Restorative Service Providers"

## 2023-11-25 DIAGNOSIS — R293 Abnormal posture: Secondary | ICD-10-CM | POA: Diagnosis not present

## 2023-11-25 DIAGNOSIS — G8929 Other chronic pain: Secondary | ICD-10-CM | POA: Insufficient documentation

## 2023-11-25 DIAGNOSIS — M542 Cervicalgia: Secondary | ICD-10-CM | POA: Diagnosis not present

## 2023-11-25 DIAGNOSIS — M25511 Pain in right shoulder: Secondary | ICD-10-CM | POA: Diagnosis not present

## 2023-11-25 NOTE — Therapy (Signed)
 OUTPATIENT PHYSICAL THERAPY EVALUATION   Patient Name: Katie Lawson MRN: [id number] DOB:April 25, 1965, 58 y.o., female Today's Date: 11/25/2023  END OF SESSION:  PT End of Session - 11/25/23 0917     Visit Number 1    Number of Visits 12    Date for PT Re-Evaluation 01/06/24    Authorization Type [employer]    Progress Note Due on Visit 10    PT Start Time 0824    PT Stop Time 0915    PT Time Calculation (min) 51 min    Activity Tolerance Patient tolerated treatment well;No increased pain    Behavior During Therapy WFL for tasks assessed/performed          Past Medical History:  Diagnosis Date   Abnormal findings on diagnostic imaging of gall bladder    low EF   Elevated IOP    watch for glaucoma   Fibroid, uterine    seen on ct   Normal cardiac stress test    1 2015   History reviewed. No pertinent surgical history. Patient Active Problem List   Diagnosis Date Noted   Mild anemia 04/18/2012   Visit for preventive health examination 02/24/2011   Adult acne 02/24/2011   Fibroids 02/24/2011   Elevated IOP    Abnormal findings on diagnostic imaging of gall bladder     REFERRING PROVIDER:   Janey Santos, MD    REFERRING DIAG: M50.30 (ICD-10-CM) - Other cervical disc degeneration, unspecified cervical region   Rationale for Evaluation and Treatment: Rehabilitation  THERAPY DIAG:  Cervicalgia  Chronic right shoulder pain  Abnormal posture  ONSET DATE: several years   SUBJECTIVE:                                                                                                                                                                                           SUBJECTIVE STATEMENT: I have had pain for several years. Got an MRI when I was in [place]; they said PT would help. I hurts sometimes really bad. Head felt too heavy for my neck and sometimes I have to hold my head. I am in the gym everyday. With lateral riase, my R shoulder hurts.    PERTINENT HISTORY:  Neck pain x several years; insidious onset.   PAIN:  Are you having pain? Yes: NPRS scale: 2/10 Pain location: R shoulder Pain description: achey Aggravating factors: shoulder abduction Relieving factors: rest  PRECAUTIONS:  None  RED FLAGS: None   WEIGHT BEARING RESTRICTIONS:  No  FALLS:  Has patient fallen in last 6 months? No  LIVING ENVIRONMENT: Lives with: lives with their family Lives in:  House/apartment  OCCUPATION:  Mental Health Therapist; stands at the computer for work  PLOF:  Independent  PATIENT GOALS:  Don't want it to become like my Mom's pain     OBJECTIVE:  Note: Objective measures were completed at Evaluation unless otherwise noted.  DIAGNOSTIC FINDINGS:  No testing on R shoulder; pt had an MRI in [place] but will bring in results  PATIENT SURVEYS:  NDI:  NECK DISABILITY INDEX  Date: 11/25/23 Score  Pain intensity 3 = The pain is fairly severe at the moment  2. Personal care (washing, dressing, etc.) 2 = It is painful to look after myself and I am slow and careful  3. Lifting 4 =  I can only lift very light weights  4. Reading 1 = I can read as much as I want to with slight pain in my neck  5. Headaches 2 =  I have moderate headaches, which come infrequently  6. Concentration 1 =  I can concentrate fully when I want to with slight difficulty   7. Work 1 =  I can only do my usual work, but no more  8. Driving 2 =  I can drive my car as long as I want with moderate pain in my neck  9. Sleeping 1 = My sleep is slightly disturbed (less than 1 hr sleepless)  10. Recreation 3 = I am able to engage in a few of my usual recreation activities because of pain in   my neck  Total 20/50   Minimum Detectable Change (90% confidence): 5 points or 10% points  COGNITIVE STATUS: Within functional limits for tasks assessed   SENSATION: WFL    POSTURE:  rounded shoulders and forward head   Body Part #1 Cervical and  Shoulder  PALPATION: Scalene tightness bil R > L; R scapula hypermobility; R Upper Trap tighter, C4-6 hypomobility  CERVICAL ROM:   Active ROM A/PROM (deg) eval  Flexion 21  Extension 19  Right lateral flexion 15  Left lateral flexion 20  Right rotation 16  Left rotation 18   (Blank rows = not tested)    SPECIAL TESTS:  Cervical Spurling's test: Positive and Distraction test: Positive        Body Part #2 Shoulder  PALPATION: R scapula hypermobility; R Upper Trap tighter  UPPER EXTREMITY ROM:  Active ROM Right eval Left eval  Shoulder flexion [facility]  Shoulder extension [facility]  Shoulder abduction [facility]  Shoulder adduction    Shoulder extension    Shoulder internal rotation [facility]  Shoulder external rotation    Elbow flexion    Elbow extension    Wrist flexion    Wrist extension    Wrist ulnar deviation    Wrist radial deviation    Wrist pronation    Wrist supination     (Blank rows = not tested)  R shoulder strength  WFL    SPECIAL TESTS:  Upper Extremity Impingement tests: Neer impingement test: positive  and Hawkins/Kennedy impingement test: positive  Lift off: negative  TREATMENT DATE:  11/25/23: Eval done. See pt education section     PATIENT EDUCATION:  Education details: HEP, importance of posture, workstation setup and making sure she is looking straight at the computer instead of to the right, anatomy as relates to pain and impingement. Education performed during manual and HEP issuance Person educated: Patient Education method: Explanation, Demonstration, and Handouts Education comprehension: verbalized understanding and returned demonstration  HOME EXERCISE PROGRAM: Access Code: [email or web address] URL: [email or web address] Date: 11/25/2023 Prepared by: Alger Ada  Exercises - Supine Cervical  Retraction with Towel  - 2 x daily - 7 x weekly - 10 reps (but performed with regular pillow, not towel) - Seated Scapular Retraction  - 2 x daily - 7 x weekly - 10 reps - Doorway Pec Stretch at 90 Degrees Abduction  - 2 x daily - 7 x weekly - 3 reps - 30 sec hold   ASSESSMENT:  CLINICAL IMPRESSION: Patient is a 58 y.o. female who was seen today for physical therapy evaluation and treatment for neck pain and R shoulder impingement. Pt demonstrates _ R shoulder impingement and cervical paraspinal and upper trap tightness R > L with hypomobility palpated with AROM at C4-6. Pt would benefit from further PT for manual therapy and therex to improve cervical flexibility and pec tightness and postural strengthening to assist with cervical and R shoulder pain. Modalities may be needed for R shoulder impingement. Pt demonstrated improvement in cervical rotation bil with reduced pain after manual therapy and HEP demonstration today.    OBJECTIVE IMPAIRMENTS: decreased activity tolerance, decreased mobility, decreased ROM, decreased strength, impaired flexibility, postural dysfunction, and pain.   ACTIVITY LIMITATIONS: carrying and lifting  PARTICIPATION LIMITATIONS: meal prep, shopping, occupation, and yard work  PERSONAL FACTORS: 1 comorbidity: posture are also affecting patient's functional outcome.   REHAB POTENTIAL: Excellent  CLINICAL DECISION MAKING: Stable/uncomplicated  EVALUATION COMPLEXITY: Low   GOALS: Goals reviewed with patient? Yes  SHORT TERM GOALS: Target date: 12/16/23  Pt will be independent with initial HEP to assist with pain management Baseline: initiated at eval Goal status: INITIAL  2.  Pt will be able to demo cervical extension with 25% less pain Baseline: unable Goal status: INITIAL  3.  Pt will be able to garden with 25% less neck pain Baseline: unable without neck pain Goal status: INITIAL  4.  Pt will report R shoulder pain to be improved x 25% with  shoulder abduction Baseline: inconsistently present Goal status: INITIAL    LONG TERM GOALS: Target date: 01/06/24  Pt will be able to perform a lateral raise without R shoulder pain.  Baseline: 2/10 Goal status: INITIAL  2.  Pt will be able to participate in Zumba without R shoulder pain.  Baseline: 4/10 Goal status: INITIAL  3.  Pt will be able to report no neck heaviness or need to support head x 2 weeks  Baseline: inconsistent Goal status: INITIAL  4.  Pt will be able to perform cervical extension with 75% less difficulty Baseline: unable Goal status: INITIAL  5.  Pt will demo improvement in cervical AROM >/=5 degrees all planes without pain to perform work duties. Baseline:  Active ROM A/PROM (deg) eval  Flexion 21  Extension 19  Right lateral flexion 15  Left lateral flexion 20  Right rotation 16  Left rotation 18   Goal status: INITIAL     PLAN:  PT FREQUENCY: 1-2x/week  PT DURATION: 6 weeks  PLANNED INTERVENTIONS: 97164- PT Re-evaluation, 97110-Therapeutic exercises, 97530- Therapeutic activity,  [id number]- Manual therapy, [id number]- Ultrasound, [id number]- Ionotophoresis 4mg /ml Dexamethasone, Patient/Family education, Taping, and Joint mobilization.  PLAN FOR NEXT SESSION: Review HEP; cervical manual therapy, scapular strengthening bil; pec stretching/manual therapy; progress chin tuck therex; pt is to bring in MRI next visit.   Alger Ada, PT 11/25/2023, 12:53 PM

## 2024-01-13 ENCOUNTER — Encounter (HOSPITAL_BASED_OUTPATIENT_CLINIC_OR_DEPARTMENT_OTHER): Payer: Self-pay

## 2024-01-13 ENCOUNTER — Encounter (HOSPITAL_BASED_OUTPATIENT_CLINIC_OR_DEPARTMENT_OTHER): Admitting: Rehabilitative and Restorative Service Providers"
